# Patient Record
Sex: Male | Born: 1990 | Race: White | Hispanic: No | Marital: Single | Smoking: Current some day smoker
Health system: Southern US, Community
[De-identification: ages and names within clinical notes are randomized; demographics above are authoritative.]

## PROBLEM LIST (undated history)

## (undated) DIAGNOSIS — F909 Attention-deficit hyperactivity disorder, unspecified type: Secondary | ICD-10-CM

## (undated) DIAGNOSIS — J45909 Unspecified asthma, uncomplicated: Secondary | ICD-10-CM

## (undated) HISTORY — PX: TONSILLECTOMY: SUR1361

## (undated) HISTORY — PX: WRIST SURGERY: SHX841

## (undated) HISTORY — PX: MOUTH SURGERY: SHX715

---
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
## 2012-01-20 ENCOUNTER — Emergency Department: Payer: Self-pay | Admitting: Emergency Medicine

## 2014-09-04 ENCOUNTER — Emergency Department: Payer: Self-pay | Admitting: Internal Medicine

## 2015-07-06 ENCOUNTER — Emergency Department
Admission: EM | Admit: 2015-07-06 | Discharge: 2015-07-06 | Disposition: A | Discharge status: Home / Self Care | Payer: Self-pay | Attending: Emergency Medicine | Admitting: Emergency Medicine

## 2015-07-06 ENCOUNTER — Encounter: Payer: Self-pay | Admitting: Emergency Medicine

## 2015-07-06 DIAGNOSIS — L03114 Cellulitis of left upper limb: Secondary | ICD-10-CM | POA: Insufficient documentation

## 2015-07-06 DIAGNOSIS — F17200 Nicotine dependence, unspecified, uncomplicated: Secondary | ICD-10-CM | POA: Insufficient documentation

## 2015-07-06 MED ORDER — CEPHALEXIN 500 MG PO CAPS: 500.0000 mg | ORAL_CAPSULE | Freq: Four times a day (QID) | ORAL | Status: DC

## 2015-07-06 NOTE — ED Notes (Signed)
Patient presents to the ED with a red, swollen, painful area to his left arm that he believes may be a spider bite. Patient first noticed area at 9:30am and states area has become much more swollen since that time.   Area has two small black dots in the center.  Patient is in no obvious distress at this time.

## 2015-07-06 NOTE — ED Provider Notes (Signed)
[facility] Emergency Department Provider Note  ____________________________________________  Time seen: Approximately 7:32 PM  I have reviewed the triage vital signs and the nursing notes.   HISTORY  Chief Complaint Abscess    HPI Shawn Fowler is a 24 y.o. male patient presents to the emergency department complaining of raised erythematous lesion to left shoulder. He states that symptoms began this morning. He denies any known bite or injury to area. He states the area is tender palpation, swollen, warm to touch. He denies any fevers or chills, shortness of breath, chest pain, nausea or vomiting. Patient is concerned that he might of been "bitten by a spider." He also endorses to similar lesions to left posterior shoulder. He has not tried medications prior to arrival.   History reviewed. No pertinent past medical history.  There are no active problems to display for this patient.   Past Surgical History  Procedure Laterality Date  . Mouth surgery    . Tonsillectomy      Current Outpatient Rx  Name  Route  Sig  Dispense  Refill  . cephALEXin (KEFLEX) 500 MG capsule   Oral   Take 1 capsule (500 mg total) by mouth 4 (four) times daily.   28 capsule   0     Allergies Review of patient's allergies indicates no known allergies.  No family history on file.  Social History Social History  Substance Use Topics  . Smoking status: Current Every Day Smoker -- 0.25 packs/day  . Smokeless tobacco: None  . Alcohol Use: Yes     Comment: occasionally    Review of Systems Constitutional: No fever/chills Eyes: No visual changes. ENT: No sore throat. Cardiovascular: Denies chest pain. Respiratory: Denies shortness of breath. Gastrointestinal: No abdominal pain.  No nausea, no vomiting.  No diarrhea.  No constipation. Genitourinary: Negative for dysuria. Musculoskeletal: Negative for back pain. Skin: Negative for rash. Endorses erythematous and  edematous lesions to left upper arm and shoulder. Neurological: Negative for headaches, focal weakness or numbness.  10-point ROS otherwise negative.  ____________________________________________   PHYSICAL EXAM:  VITAL SIGNS: ED Triage Vitals  Enc Vitals Group     BP --      Pulse --      Resp --      Temp --      Temp src --      SpO2 --      Weight --      Height --      Head Cir --      Peak Flow --      Pain Score 07/06/15 1927 7     Pain Loc --      Pain Edu? --      Excl. in GC? --     Constitutional: Alert and oriented. Well appearing and in no acute distress. Eyes: Conjunctivae are normal. PERRL. EOMI. Head: Atraumatic. Nose: No congestion/rhinnorhea. Mouth/Throat: Mucous membranes are moist.  Oropharynx non-erythematous. Neck: No stridor.   Hematological/Lymphatic/Immunilogical: No cervical lymphadenopathy. Cardiovascular: Normal rate, regular rhythm. Grossly normal heart sounds.  Good peripheral circulation. Respiratory: Normal respiratory effort.  No retractions. Lungs CTAB. Gastrointestinal: Soft and nontender. No distention. No abdominal bruits. No CVA tenderness. Musculoskeletal: No lower extremity tenderness nor edema.  No joint effusions. Neurologic:  Normal speech and language. No gross focal neurologic deficits are appreciated. No gait instability. Skin:  Skin is warm, dry and intact. No rash noted. Erythematous, edematous lesion noted to left lateral upper arm. Area  has minimal central erosion. No pustular drainage. Area is firm to palpation with no fluctuance. Similar appearing lesions are noted to posterior left shoulder. Those areas are firm to palpation with no fluctuance and drainage noted. No surrounding erythema or ecchymosis are noted. Psychiatric: Mood and affect are normal. Speech and behavior are normal.  ____________________________________________   LABS (all labs ordered are listed, but only abnormal results are displayed)  Labs  Reviewed - No data to display ____________________________________________  EKG   ____________________________________________  RADIOLOGY   ____________________________________________   PROCEDURES  Procedure(s) performed: None  Critical Care performed: No  ____________________________________________   INITIAL IMPRESSION / ASSESSMENT AND PLAN / ED COURSE  Pertinent labs & imaging results that were available during my care of the patient were reviewed by me and considered in my medical decision making (see chart for details).  Patient's history, symptoms, physical exam are taken into consideration for diagnosis. Diagnosis consistent with cellulitis from unknown etiology. There is no fluctuance or need for incision and drainage at this time. I advised the patient to place him on antibiotics and that he can take Tylenol, ibuprofen, hydrocortisone cream for any additional symptomatic control. She was given strict ED precautions to return for worsening of symptoms to include fevers or chills, increasing pain, increasing swelling or drainage. Patient verbalizes understanding of the diagnosis and treatment plan and verbalizes compliance with same. ____________________________________________   FINAL CLINICAL IMPRESSION(S) / ED DIAGNOSES  Final diagnoses:  Cellulitis of left upper extremity      Racheal PatchesJonathan D Cuthriell, PA-C 07/06/15 1951  Jennye MoccasinBrian S Quigley, MD 07/06/15 2021

## 2015-07-06 NOTE — Discharge Instructions (Signed)

## 2016-06-21 ENCOUNTER — Encounter: Payer: Self-pay | Admitting: *Deleted

## 2016-06-21 ENCOUNTER — Emergency Department
Admission: EM | Admit: 2016-06-21 | Discharge: 2016-06-21 | Disposition: A | Discharge status: Home / Self Care | Payer: Self-pay | Attending: Student in an Organized Health Care Education/Training Program | Admitting: Student in an Organized Health Care Education/Training Program

## 2016-06-21 DIAGNOSIS — F17200 Nicotine dependence, unspecified, uncomplicated: Secondary | ICD-10-CM | POA: Insufficient documentation

## 2016-06-21 DIAGNOSIS — Z792 Long term (current) use of antibiotics: Secondary | ICD-10-CM | POA: Insufficient documentation

## 2016-06-21 DIAGNOSIS — L02415 Cutaneous abscess of right lower limb: Secondary | ICD-10-CM | POA: Insufficient documentation

## 2016-06-21 DIAGNOSIS — L0291 Cutaneous abscess, unspecified: Secondary | ICD-10-CM

## 2016-06-21 MED ORDER — SULFAMETHOXAZOLE-TRIMETHOPRIM 400-80 MG PO TABS: 1.0000 | ORAL_TABLET | Freq: Two times a day (BID) | ORAL | 0 refills | Status: AC

## 2016-06-21 MED ORDER — SULFAMETHOXAZOLE-TRIMETHOPRIM 800-160 MG PO TABS
ORAL_TABLET | ORAL | Status: AC
  Administered 2016-06-21: 1 via ORAL
  Filled 2016-06-21: qty 1

## 2016-06-21 MED ORDER — SULFAMETHOXAZOLE-TRIMETHOPRIM 800-160 MG PO TABS
1.0000 | ORAL_TABLET | Freq: Once | ORAL | Status: AC
  Administered 2016-06-21: 1 via ORAL

## 2016-06-21 NOTE — ED Notes (Signed)
Pt c/o area of redness with black center above left knee that began Monday. Pt Squeezed area until black center ruptured. Pt reports he has been squeezing out pink/purple/yellow/black and bloody drainage. Area is reddened and warm to touch.

## 2016-06-21 NOTE — ED Notes (Signed)
Reviewed d/c instructions, follow-up care and prescription with pt. Pt verbalized understanding 

## 2016-06-21 NOTE — ED Triage Notes (Signed)
Pt to triage via wheelchair.  Pt has possible spider bite to left upper leg above the knee.  Area red with drainage.

## 2016-06-21 NOTE — ED Provider Notes (Signed)
[facility] Emergency Department Provider Note  ____________________________________________  Time seen: Approximately 11:25 PM  I have reviewed the triage vital signs and the nursing notes.   HISTORY  Chief Complaint Insect Bite   HPI Shawn Fowler is a 25 y.o. male that presents with area of swelling and warmth above the left knee for 2 days. This morning patient squeezed the area until it started draining yellow and brown bloody pus. Area is painful and warm to touch. Patient did not see insect. Patient has never had anything like this before. Patient has not taken anything for pain. Patient is not allergic to any antibiotics.  No past medical history on file.  There are no active problems to display for this patient.   Past Surgical History:  Procedure Laterality Date  . MOUTH SURGERY    . TONSILLECTOMY      Prior to Admission medications   Medication Sig Start Date End Date Taking? Authorizing Provider  cephALEXin (KEFLEX) 500 MG capsule Take 1 capsule (500 mg total) by mouth 4 (four) times daily. 07/06/15   Delorise RoyalsJonathan D Cuthriell, PA-C  sulfamethoxazole-trimethoprim (BACTRIM) 400-80 MG tablet Take 1 tablet by mouth 2 (two) times daily. 06/21/16 07/01/16  Enid DerryAshley Korri Ask, PA-C    Allergies Patient has no known allergies.  No family history on file.  Social History Social History  Substance Use Topics  . Smoking status: Current Every Day Smoker    Packs/day: 0.25  . Smokeless tobacco: Never Used  . Alcohol use Yes     Comment: occasionally    Review of Systems  Constitutional: Negative for fever/chills Respiratory: Negative for shortness of breath. Musculoskeletal: Positive for knee pain. Limited ROM of knee. Neurological: Negative for focal weakness or numbness. ____________________________________________   PHYSICAL EXAM:  VITAL SIGNS: ED Triage Vitals  Enc Vitals Group     BP 06/21/16 2148 136/78     Pulse Rate 06/21/16 2148 96      Resp 06/21/16 2148 18     Temp 06/21/16 2148 98.3 F (36.8 C)     Temp Source 06/21/16 2148 Oral     SpO2 06/21/16 2148 99 %     Weight 06/21/16 2150 296 lb (134.3 kg)     Height 06/21/16 2150 6\' 2"  (1.88 m)     Head Circumference --      Peak Flow --      Pain Score 06/21/16 2150 10     Pain Loc --      Pain Edu? --      Excl. in GC? --      Constitutional: Alert and oriented. Well appearing and in no acute distress. Eyes: Conjunctivae are normal. EOMI. Nose: No congestion/rhinnorhea. Mouth/Throat: Mucous membranes are moist.   Neck: No stridor. Cardiovascular: Good peripheral circulation. Respiratory: Normal respiratory effort.  No retractions.  Musculoskeletal: FROM throughout. Neurologic:  Normal speech and language. No gross focal neurologic deficits are appreciated. Skin: 3 cm area of swelling and erythema above right knee. Warm to touch. Area actively draining pus and blood.  ____________________________________________   LABS (all labs ordered are listed, but only abnormal results are displayed)  Labs Reviewed  AEROBIC CULTURE (SUPERFICIAL SPECIMEN)   ____________________________________________   INITIAL IMPRESSION / ASSESSMENT AND PLAN / ED COURSE  Clinical Course     Pertinent labs & imaging results that were available during my care of the patient were reviewed by me and considered in my medical decision making (see chart for details).  My assessment  is that patient has an abscess. Since area is actively draining, it does not need to be incised. Patient should take Bactrim for 10 days.  Shawn Fowler was advised to follow up with PCP.  Shawn Fowler was also advised to return to the emergency department for symptoms that change or worsen if unable to schedule an appointment.  ____________________________________________   FINAL CLINICAL IMPRESSION(S) / ED DIAGNOSES  Final diagnoses:  Abscess    Discharge Medication List as of 06/21/2016  11:00 PM    START taking these medications   Details  sulfamethoxazole-trimethoprim (BACTRIM) 400-80 MG tablet Take 1 tablet by mouth 2 (two) times daily., Starting Thu 06/21/2016, Until Sun 07/01/2016, Print        Note:  This document was prepared using Dragon voice recognition software and may include unintentional dictation errors.    Enid DerryAshley Jorge Retz, PA-C 06/21/16 2332    Willy EddyPatrick Robinson, MD 06/21/16 [phone]

## 2016-06-24 LAB — AEROBIC CULTURE W GRAM STAIN (SUPERFICIAL SPECIMEN)

## 2016-06-24 LAB — AEROBIC CULTURE  (SUPERFICIAL SPECIMEN)

## 2018-07-16 ENCOUNTER — Other Ambulatory Visit: Payer: Self-pay

## 2018-07-16 ENCOUNTER — Emergency Department
Admission: EM | Admit: 2018-07-16 | Discharge: 2018-07-16 | Disposition: A | Discharge status: Home / Self Care | Payer: Self-pay | Attending: Emergency Medicine | Admitting: Emergency Medicine

## 2018-07-16 ENCOUNTER — Emergency Department: Payer: Self-pay

## 2018-07-16 ENCOUNTER — Encounter: Payer: Self-pay | Admitting: Emergency Medicine

## 2018-07-16 DIAGNOSIS — R3911 Hesitancy of micturition: Secondary | ICD-10-CM | POA: Insufficient documentation

## 2018-07-16 DIAGNOSIS — J40 Bronchitis, not specified as acute or chronic: Secondary | ICD-10-CM | POA: Insufficient documentation

## 2018-07-16 DIAGNOSIS — R0602 Shortness of breath: Secondary | ICD-10-CM | POA: Insufficient documentation

## 2018-07-16 DIAGNOSIS — R300 Dysuria: Secondary | ICD-10-CM | POA: Insufficient documentation

## 2018-07-16 DIAGNOSIS — F17210 Nicotine dependence, cigarettes, uncomplicated: Secondary | ICD-10-CM | POA: Insufficient documentation

## 2018-07-16 HISTORY — DX: Attention-deficit hyperactivity disorder, unspecified type: F90.9

## 2018-07-16 HISTORY — DX: Unspecified asthma, uncomplicated: J45.909

## 2018-07-16 LAB — URINALYSIS, COMPLETE (UACMP) WITH MICROSCOPIC
Bacteria, UA: NONE SEEN
Bilirubin Urine: NEGATIVE
Glucose, UA: NEGATIVE mg/dL
Hgb urine dipstick: NEGATIVE
KETONES UR: NEGATIVE mg/dL
Leukocytes, UA: NEGATIVE
Nitrite: NEGATIVE
PH: 6 (ref 5.0–8.0)
Protein, ur: NEGATIVE mg/dL
Specific Gravity, Urine: 1.016 (ref 1.005–1.030)
Squamous Epithelial / LPF: NONE SEEN (ref 0–5)

## 2018-07-16 MED ORDER — ALBUTEROL SULFATE (2.5 MG/3ML) 0.083% IN NEBU
2.5000 mg | INHALATION_SOLUTION | Freq: Once | RESPIRATORY_TRACT | Status: AC
  Administered 2018-07-16: 2.5 mg via RESPIRATORY_TRACT
  Filled 2018-07-16: qty 3

## 2018-07-16 MED ORDER — METHYLPREDNISOLONE SODIUM SUCC 125 MG IJ SOLR
125.0000 mg | Freq: Once | INTRAMUSCULAR | Status: AC
  Administered 2018-07-16: 125 mg via INTRAMUSCULAR
  Filled 2018-07-16: qty 2

## 2018-07-16 NOTE — ED Notes (Addendum)
Pt to doorway and states he is wanting to "walk out". He is feeling better and does not want to wait for his discharge papers or his prescriptions.  Verbalized understanding of needing to return if symptoms worsen. Computers down so pt unable to sign.

## 2018-07-16 NOTE — ED Triage Notes (Signed)
Pt in via POV, reports cough, congestion, shortness of breath x approximately 2 weeks.  Pt is a smoker, hx of asthma.  Pt also reports urinary symptoms, states it is difficulty to start stream, urine w/ odor to it.  Vitals WDL, NAD noted at this time.

## 2018-07-16 NOTE — ED Notes (Addendum)
See triage note. Presents with a 2 week hx of congestion and cough.states cough has been prod at times  Denies any fever   Some SOB and feels like he can't get a good breath

## 2018-07-16 NOTE — ED Provider Notes (Signed)
[facility] Emergency Department Provider Note  ____________________________________________  Time seen: Approximately 11:22 PM  I have reviewed the triage vital signs and the nursing notes.   HISTORY  Chief Complaint URI    HPI Shawn Fowler is a 27 y.o. male who presents the emergency department for complaint of nasal congestion, coughing, shortness of breath, wheezing, dysuria and urinary hesitancy.   Patient presents primarily for cough, shortness of breath that has been ongoing x2 weeks.  Patient reports he has asthma, and was unsure whether he had viral upper respiratory symptoms or an asthma exacerbation.  Patient reports symptoms became worse over the past 3 days.  He presents the emergency department tonight for evaluation of same.  He denies any ear pain, sore throat, fevers or chills, chest pain, difficulty breathing.  Patient does report chest tightness with wheezing.  Patient is also requesting evaluation of dysuria, urinary hesitancy.  Patient reports that C the symptoms have been off and on but primarily the last week.  Patient denies any discharge.  No abdominal pain, suprapubic pain, flank pain.  No hematuria.  No flank pain or history of kidney stone.   Past Medical History:  Diagnosis Date  . ADHD   . Asthma     There are no active problems to display for this patient.   Past Surgical History:  Procedure Laterality Date  . MOUTH SURGERY    . TONSILLECTOMY      Prior to Admission medications   Medication Sig Start Date End Date Taking? Authorizing Provider  cephALEXin (KEFLEX) 500 MG capsule Take 1 capsule (500 mg total) by mouth 4 (four) times daily. 07/06/15   Gaje Tennyson, Delorise RoyalsJonathan D, PA-C    Allergies Patient has no known allergies.  No family history on file.  Social History Social History   Tobacco Use  . Smoking status: Current Every Day Smoker    Packs/day: 0.25  . Smokeless tobacco: Never Used  Substance Use Topics   . Alcohol use: Yes    Comment: occasionally  . Drug use: Not Currently     Review of Systems  Constitutional: No fever/chills Eyes: No visual changes. No discharge ENT: No upper respiratory complaints. Cardiovascular: no chest pain. Respiratory: Positive cough.  Positive SOB. Gastrointestinal: No abdominal pain.  No nausea, no vomiting.  No diarrhea.  No constipation. Genitourinary: Positive for dysuria and urinary hesitancy.. No hematuria Musculoskeletal: Negative for musculoskeletal pain. Skin: Negative for rash, abrasions, lacerations, ecchymosis. Neurological: Negative for headaches, focal weakness or numbness. 10-point ROS otherwise negative.  ____________________________________________   PHYSICAL EXAM:  VITAL SIGNS: ED Triage Vitals  Enc Vitals Group     BP 07/16/18 1801 (!) 148/79     Pulse Rate 07/16/18 1801 76     Resp --      Temp 07/16/18 1801 97.7 F (36.5 C)     Temp Source 07/16/18 1801 Oral     SpO2 07/16/18 1801 99 %     Weight 07/16/18 1801 220 lb (99.8 kg)     Height 07/16/18 1801 6\' 2"  (1.88 m)     Head Circumference --      Peak Flow --      Pain Score 07/16/18 1808 4     Pain Loc --      Pain Edu? --      Excl. in GC? --      Constitutional: Alert and oriented. Well appearing and in no acute distress. Eyes: Conjunctivae are normal. PERRL. EOMI. Head: Atraumatic.  ENT:      Ears: EACs and TMs unremarkable bilaterally      Nose: No congestion/rhinnorhea.      Mouth/Throat: Mucous membranes are moist.  Neck: No stridor.  Neck is supple for range of motion Hematological/Lymphatic/Immunilogical: No cervical lymphadenopathy. Cardiovascular: Normal rate, regular rhythm. Normal S1 and S2.  Good peripheral circulation. Respiratory: Normal respiratory effort without tachypnea or retractions. Lungs with diffuse expiratory wheezes.  No rales or rhonchi.Peri Jefferson air entry to the bases with no decreased or absent breath sounds. Gastrointestinal: Bowel  sounds 4 quadrants. Soft and nontender to palpation. No guarding or rigidity. No palpable masses. No distention. No CVA tenderness. Musculoskeletal: Full range of motion to all extremities. No gross deformities appreciated. Neurologic:  Normal speech and language. No gross focal neurologic deficits are appreciated.  Skin:  Skin is warm, dry and intact. No rash noted. Psychiatric: Mood and affect are normal. Speech and behavior are normal. Patient exhibits appropriate insight and judgement.   ____________________________________________   LABS (all labs ordered are listed, but only abnormal results are displayed)  Labs Reviewed  URINALYSIS, COMPLETE (UACMP) WITH MICROSCOPIC - Abnormal; Notable for the following components:      Result Value   Color, Urine YELLOW (*)    APPearance CLEAR (*)    All other components within normal limits  CHLAMYDIA/NGC RT PCR ([facility] ONLY)   ____________________________________________  EKG   ____________________________________________  RADIOLOGY I personally viewed and evaluated these images as part of my medical decision making, as well as reviewing the written report by the radiologist.  Dg Chest 2 View  Result Date: 07/16/2018 CLINICAL DATA:  Productive cough, shortness of breath EXAM: CHEST - 2 VIEW COMPARISON:  07/30/2012 FINDINGS: Mild peribronchial thickening. Heart and mediastinal contours are within normal limits. No focal opacities or effusions. No acute bony abnormality. IMPRESSION: Mild bronchitic changes. Electronically Signed   By: Charlett Nose M.D.   On: 07/16/2018 19:04    ____________________________________________    PROCEDURES  Procedure(s) performed:    Procedures    Medications  albuterol (PROVENTIL) (2.5 MG/3ML) 0.083% nebulizer solution 2.5 mg (2.5 mg Nebulization Given 07/16/18 2119)  methylPREDNISolone sodium succinate (SOLU-MEDROL) 125 mg/2 mL injection 125 mg (125 mg Intramuscular Given 07/16/18 2119)      ____________________________________________   INITIAL IMPRESSION / ASSESSMENT AND PLAN / ED COURSE  Pertinent labs & imaging results that were available during my care of the patient were reviewed by me and considered in my medical decision making (see chart for details).  Review of the [place] CSRS was performed in accordance of the [facility] prior to dispensing any controlled drugs.      Patient's diagnosis is consistent with bronchitis and urinary hesitancy.  Patient presents the emergency department with 2 complaints.  Main complaint was cough, shortness of breath.  Patient does have a history of asthma.  Exam is consistent with bronchitis with mild asthma exacerbation.  Patient received Solu-Medrol and albuterol emergency department with good symptom improvement.  Patient was evaluated with urinalysis and gonorrhea and chlamydia.  Patient was awaiting gonorrhea and Chlamydia testing, the lab was called and reports that they were running same.  No results were ever obtained for gonorrhea and chlamydia and lab was was unable to find sample or provide results.  Patient was also awaiting further discharge instructions and paperwork, however an unplanned computer maintenance, systemwide shutdown computers for over an hour.  Patient would not wait for discharge paperwork and prescriptions and leaves AMA..  No prescriptions are  provided to patient.  Patient is given ED precautions to return to the ED for any worsening or new symptoms.     ____________________________________________  FINAL CLINICAL IMPRESSION(S) / ED DIAGNOSES  Final diagnoses:  Bronchitis  Urinary hesitancy      NEW MEDICATIONS STARTED DURING THIS VISIT:  ED Discharge Orders    None          This chart was dictated using voice recognition software/Dragon. Despite best efforts to proofread, errors can occur which can change the meaning. Any change was purely unintentional.    Racheal Patches,  PA-C 07/16/18 2334    Nita Sickle, MD 07/17/18 [phone]

## 2018-07-16 NOTE — ED Notes (Signed)
Lab to add on test to urine already in lab

## 2018-07-17 LAB — CHLAMYDIA/NGC RT PCR (ARMC ONLY)
Chlamydia Tr: NOT DETECTED
N gonorrhoeae: NOT DETECTED

## 2018-07-18 ENCOUNTER — Other Ambulatory Visit: Payer: Self-pay

## 2018-07-18 ENCOUNTER — Encounter (HOSPITAL_COMMUNITY): Payer: Self-pay | Admitting: Emergency Medicine

## 2018-07-18 ENCOUNTER — Emergency Department (HOSPITAL_COMMUNITY)
Admission: EM | Admit: 2018-07-18 | Discharge: 2018-07-18 | Disposition: A | Discharge status: Home / Self Care | Payer: Self-pay | Attending: Emergency Medicine | Admitting: Emergency Medicine

## 2018-07-18 DIAGNOSIS — K029 Dental caries, unspecified: Secondary | ICD-10-CM | POA: Insufficient documentation

## 2018-07-18 DIAGNOSIS — F17210 Nicotine dependence, cigarettes, uncomplicated: Secondary | ICD-10-CM | POA: Insufficient documentation

## 2018-07-18 DIAGNOSIS — J45909 Unspecified asthma, uncomplicated: Secondary | ICD-10-CM | POA: Insufficient documentation

## 2018-07-18 MED ORDER — CLINDAMYCIN HCL 150 MG PO CAPS
150.0000 mg | ORAL_CAPSULE | Freq: Once | ORAL | Status: AC
  Administered 2018-07-18: 150 mg via ORAL
  Filled 2018-07-18: qty 1

## 2018-07-18 MED ORDER — CLINDAMYCIN HCL 150 MG PO CAPS: 150.0000 mg | ORAL_CAPSULE | Freq: Four times a day (QID) | ORAL | 0 refills | Status: DC

## 2018-07-18 MED ORDER — ACETAMINOPHEN 325 MG PO TABS
650.0000 mg | ORAL_TABLET | Freq: Once | ORAL | Status: AC
  Administered 2018-07-18: 650 mg via ORAL
  Filled 2018-07-18: qty 2

## 2018-07-18 NOTE — ED Triage Notes (Signed)
Pt c/o dental pain and sensitivity in multiple areas of mouth x a cpl of mos

## 2018-07-18 NOTE — ED Provider Notes (Signed)
[facility] EMERGENCY DEPARTMENT Provider Note   CSN: [id number] Arrival date & time: 07/18/18  0259     History   Chief Complaint Chief Complaint  Patient presents with  . Dental Pain    HPI Shawn Fowler is a 27 y.o. male.  The history is provided by the patient.  He has history of asthma and attention deficit disorder and comes in complaining of dental pain for the last several months.  He has pain in multiple teeth.  Pain is sometimes worse with hot food and sometimes worse with cold food.  He has seen a dentist in the past and was told that it would cost several $100 per tooth to have fillings placed and he cannot afford that.  So he has not gone back to the dentist.  He also relates that he has been sucking on a lot of candy because he is trying to quit smoking.  Past Medical History:  Diagnosis Date  . ADHD   . Asthma     There are no active problems to display for this patient.   Past Surgical History:  Procedure Laterality Date  . MOUTH SURGERY    . TONSILLECTOMY          Home Medications    Prior to Admission medications   Medication Sig Start Date End Date Taking? Authorizing Provider  cephALEXin (KEFLEX) 500 MG capsule Take 1 capsule (500 mg total) by mouth 4 (four) times daily. 07/06/15   Cuthriell, Delorise Royals, PA-C    Family History History reviewed. No pertinent family history.  Social History Social History   Tobacco Use  . Smoking status: Current Every Day Smoker    Packs/day: 0.25  . Smokeless tobacco: Never Used  Substance Use Topics  . Alcohol use: Yes    Comment: occasionally  . Drug use: Not Currently     Allergies   Penicillins and Aspirin   Review of Systems Review of Systems  All other systems reviewed and are negative.    Physical Exam Updated Vital Signs BP (!) 160/101 (BP Location: Left Arm)   Pulse (!) 120   Temp 97.9 F (36.6 C) (Oral)   Resp 17   Ht 6' (1.829 m) Comment: Simultaneous filing. User may not  have seen previous data.  Wt 108.9 kg Comment: Simultaneous filing. User may not have seen previous data.  SpO2 100%   BMI 32.55 kg/m   Physical Exam  Nursing note and vitals reviewed.  27 year old male, resting comfortably and in no acute distress. Vital signs are significant for elevated blood pressure and heart rate. Oxygen saturation is 97.9%, which is normal. Head is normocephalic and atraumatic. PERRLA, EOMI. Oropharynx is clear.  Multiple dental caries are noted.  Caries are noted on teeth #2, 6, 7, 10, 11, 21, 22, 26. Neck is nontender and supple without adenopathy or JVD. Back is nontender and there is no CVA tenderness. Lungs are clear without rales, wheezes, or rhonchi. Chest is nontender. Heart has regular rate and rhythm without murmur. Abdomen is soft, flat, nontender without masses or hepatosplenomegaly and peristalsis is normoactive. Extremities have no cyanosis or edema, full range of motion is present. Skin is warm and dry without rash. Neurologic: Mental status is normal, cranial nerves are intact, there are no motor or sensory deficits.  ED Treatments / Results   Procedures Procedures  Medications Ordered in ED Medications  clindamycin (CLEOCIN) capsule 150 mg (has no administration in time range)  acetaminophen (TYLENOL)  tablet 650 mg (has no administration in time range)     Initial Impression / Assessment and Plan / ED Course  I have reviewed the triage vital signs and the nursing notes.  Multiple dental caries.  He is given Writerdental resource page, given prescription for clindamycin.  Told to use over-the-counter analgesics as needed for pain.  Final Clinical Impressions(s) / ED Diagnoses   Final diagnoses:  Dental caries    ED Discharge Orders    None       Dione BoozeGlick, Julius Boniface, MD 07/18/18 [phone]

## 2018-09-04 ENCOUNTER — Emergency Department (HOSPITAL_COMMUNITY): Payer: No Typology Code available for payment source

## 2018-09-04 ENCOUNTER — Emergency Department (HOSPITAL_COMMUNITY)
Admission: EM | Admit: 2018-09-04 | Discharge: 2018-09-04 | Disposition: A | Discharge status: Home / Self Care | Payer: No Typology Code available for payment source | Attending: Emergency Medicine | Admitting: Emergency Medicine

## 2018-09-04 ENCOUNTER — Other Ambulatory Visit: Payer: Self-pay

## 2018-09-04 ENCOUNTER — Encounter (HOSPITAL_COMMUNITY): Payer: Self-pay | Admitting: Emergency Medicine

## 2018-09-04 DIAGNOSIS — W0110XA Fall on same level from slipping, tripping and stumbling with subsequent striking against unspecified object, initial encounter: Secondary | ICD-10-CM | POA: Insufficient documentation

## 2018-09-04 DIAGNOSIS — S6992XA Unspecified injury of left wrist, hand and finger(s), initial encounter: Secondary | ICD-10-CM | POA: Diagnosis present

## 2018-09-04 DIAGNOSIS — Y9301 Activity, walking, marching and hiking: Secondary | ICD-10-CM | POA: Insufficient documentation

## 2018-09-04 DIAGNOSIS — Y999 Unspecified external cause status: Secondary | ICD-10-CM | POA: Diagnosis not present

## 2018-09-04 DIAGNOSIS — F17200 Nicotine dependence, unspecified, uncomplicated: Secondary | ICD-10-CM | POA: Insufficient documentation

## 2018-09-04 DIAGNOSIS — S63105A Unspecified dislocation of left thumb, initial encounter: Secondary | ICD-10-CM | POA: Insufficient documentation

## 2018-09-04 DIAGNOSIS — S62002A Unspecified fracture of navicular [scaphoid] bone of left wrist, initial encounter for closed fracture: Secondary | ICD-10-CM | POA: Diagnosis not present

## 2018-09-04 DIAGNOSIS — J45909 Unspecified asthma, uncomplicated: Secondary | ICD-10-CM | POA: Diagnosis not present

## 2018-09-04 DIAGNOSIS — Y929 Unspecified place or not applicable: Secondary | ICD-10-CM | POA: Diagnosis not present

## 2018-09-04 DIAGNOSIS — Z79899 Other long term (current) drug therapy: Secondary | ICD-10-CM | POA: Insufficient documentation

## 2018-09-04 MED ORDER — HYDROCODONE-ACETAMINOPHEN 5-325 MG PO TABS: 1.0000 | ORAL_TABLET | ORAL | 0 refills | Status: DC | PRN

## 2018-09-04 MED ORDER — HYDROCODONE-ACETAMINOPHEN 5-325 MG PO TABS
2.0000 | ORAL_TABLET | Freq: Once | ORAL | Status: AC
  Administered 2018-09-04: 2 via ORAL
  Filled 2018-09-04: qty 2

## 2018-09-04 MED ORDER — LIDOCAINE HCL (PF) 1 % IJ SOLN
10.0000 mg | Freq: Once | INTRAMUSCULAR | Status: DC
  Filled 2018-09-04: qty 6

## 2018-09-04 NOTE — ED Triage Notes (Signed)
Pt reports falling on LT hand yesterday. Pt noted to have edema and possible deformity to LT thumb. Pt reported some numbness last night.

## 2018-09-04 NOTE — Discharge Instructions (Signed)
Your x-ray shows that you had a dislocation of 1 of the joints of your thumb.  This is been reduced.  You also have a fracture of the bone in your wrist called the scaphoid.  It is extremely important that this is followed by the orthopedic specialist because of the type of blood supply that goes to this particular bone.  Please use your thumb spica splint until seen by the orthopedic specialist.  Please keep your arm your hand elevated above your heart as much as possible to improve pain.  Use 1000 mg of Tylenol and 600 mg of ibuprofen every 6 hours.  Use Norco for more severe pain. This medication may cause drowsiness. Please do not drink, drive, or participate in activity that requires concentration while taking this medication. Call Dr Romeo Apple today for appointment as soon as possible.

## 2018-09-04 NOTE — ED Provider Notes (Signed)
[facility] EMERGENCY DEPARTMENT Provider Note   CSN: [id number] Arrival date & time: 09/04/18  1126     History   Chief Complaint Chief Complaint  Patient presents with  . Hand Injury    HPI Shawn Fowler is a 28 y.o. male.  Patient is a 28 year old male who presents to the emergency department with complaint of left hand injury.  The patient states that he fell on the left hand while carrying heavy chain.  This injury occurred on yesterday, January 22.  He now has pain and swelling of the hand.  He particularly has pain involving the left thumb.  Patient states that he did not sleep well because of pain during the night.  He presents to the emergency department for evaluation of this injury.  The history is provided by the patient.  Hand Injury  Location:  Hand Hand location:  L hand Associated symptoms: no back pain and no neck pain     Past Medical History:  Diagnosis Date  . ADHD   . Asthma     There are no active problems to display for this patient.   Past Surgical History:  Procedure Laterality Date  . MOUTH SURGERY    . TONSILLECTOMY          Home Medications    Prior to Admission medications   Medication Sig Start Date End Date Taking? Authorizing Provider  clindamycin (CLEOCIN) 150 MG capsule Take 1 capsule (150 mg total) by mouth every 6 (six) hours. 07/18/18   Dione Booze, MD    Family History No family history on file.  Social History Social History   Tobacco Use  . Smoking status: Current Every Day Smoker    Packs/day: 0.25  . Smokeless tobacco: Never Used  Substance Use Topics  . Alcohol use: Yes    Comment: occasionally  . Drug use: Not Currently     Allergies   Penicillins and Aspirin   Review of Systems Review of Systems  Constitutional: Negative for activity change.       All ROS Neg except as noted in HPI  HENT: Negative for nosebleeds.   Eyes: Negative for photophobia and discharge.  Respiratory: Negative for  cough, shortness of breath and wheezing.   Cardiovascular: Negative for chest pain and palpitations.  Gastrointestinal: Negative for abdominal pain and blood in stool.  Genitourinary: Negative for dysuria, frequency and hematuria.  Musculoskeletal: Negative for arthralgias, back pain and neck pain.  Skin: Negative.   Neurological: Negative for dizziness, seizures and speech difficulty.  Psychiatric/Behavioral: Negative for confusion and hallucinations.     Physical Exam Updated Vital Signs BP (!) 154/97 (BP Location: Right Arm)   Pulse 97   Temp 98.2 F (36.8 C) (Oral)   Resp 20   Ht 6\' 2"  (1.88 m)   Wt 108.9 kg   SpO2 100%   BMI 30.81 kg/m   Physical Exam Vitals signs and nursing note reviewed.  Constitutional:      Appearance: He is well-developed. He is not toxic-appearing.  HENT:     Head: Normocephalic.     Right Ear: Tympanic membrane and external ear normal.     Left Ear: Tympanic membrane and external ear normal.  Eyes:     General: Lids are normal.     Pupils: Pupils are equal, round, and reactive to light.  Neck:     Musculoskeletal: Normal range of motion and neck supple.     Vascular: No carotid bruit.  Cardiovascular:  Rate and Rhythm: Normal rate and regular rhythm.     Pulses: Normal pulses.     Heart sounds: Normal heart sounds.  Pulmonary:     Effort: No respiratory distress.     Breath sounds: Normal breath sounds.  Abdominal:     General: Bowel sounds are normal.     Palpations: Abdomen is soft.     Tenderness: There is no abdominal tenderness. There is no guarding.  Musculoskeletal: Normal range of motion.        General: Signs of injury present.     Left hand: He exhibits bony tenderness and swelling. Normal sensation noted. Normal strength noted.     Comments: There is good range of motion of the left shoulder and elbow.  There is pain in the anatomical snuffbox on the left.  There is some deformity noted of the metacarpal phalangeal joint.   The capillary refill is less than 2 seconds.  The radial pulse is 2+ on the left.  Lymphadenopathy:     Head:     Right side of head: No submandibular adenopathy.     Left side of head: No submandibular adenopathy.     Cervical: No cervical adenopathy.  Skin:    General: Skin is warm and dry.  Neurological:     Mental Status: He is alert and oriented to person, place, and time.     Cranial Nerves: No cranial nerve deficit.     Sensory: No sensory deficit.  Psychiatric:        Speech: Speech normal.      ED Treatments / Results  Labs (all labs ordered are listed, but only abnormal results are displayed) Labs Reviewed - No data to display  EKG None  Radiology No results found.  Procedures .Ortho Injury Treatment Date/Time: 09/04/2018 2:44 PM Performed by: Ivery QualeBryant, Virgin Zellers, PA-C Authorized by: Ivery QualeBryant, Arieonna Medine, PA-C   Consent:    Consent obtained:  Verbal   Consent given by:  Patient   Procedural risks discussed: MCP dislocation.   Alternatives discussed:  Referral Universal protocol:    Procedure explained and questions answered to patient or proxy's satisfaction: yes     Immediately prior to procedure a time out was called: yes     Patient identity confirmed:  Arm bandInjury location: hand Location details: left hand Injury type: dislocation Dislocation type: carpometacarpal (thumb) Pre-procedure neurovascular assessment: neurovascularly intact Pre-procedure distal perfusion: normal Pre-procedure neurological function: normal Pre-procedure range of motion: reduced Anesthesia: digital block  Anesthesia: Local anesthesia used: yes Local Anesthetic: lidocaine 1% without epinephrine Anesthetic total: 7 mL  Patient sedated: NoManipulation performed: yes Reduction successful: yes Immobilization: splint Splint type: thumb spica Supplies used: Velcro Thumb Spica splint. Post-procedure neurovascular assessment: post-procedure neurovascularly intact Post-procedure  distal perfusion: normal Post-procedure neurological function: normal Post-procedure range of motion: improved Patient tolerance: Patient tolerated the procedure well with no immediate complications    (including critical care time) FRACTURE CARE LEFT WRIST. Patient sustained a fall while carrying a very heavy chain.  He sustained injury to the left hand and wrist.  An x-ray of the wrist shows a fracture at the mid and distal thirds of the scaphoid bone.  I have discussed the fracture with the patient in terms which he understands.  I also discussed the need for immobilization.  Patient was identified by armband.  The capillary refill is less than 2 seconds in the radial pulses 2+.  There are no temperature changes appreciated.  The patient was fitted with a  Velcro thumb spica splint.  Following application of the splint, there was no temperature changes.  The capillary refill remains intact.  There is no sensation of it being too tight.  The patient tolerated the procedure with minimal problem.  Patient was treated with ice pack and medication for pain.  Medications Ordered in ED Medications - No data to display   Initial Impression / Assessment and Plan / ED Course  I have reviewed the triage vital signs and the nursing notes.  Pertinent labs & imaging results that were available during my care of the patient were reviewed by me and considered in my medical decision making (see chart for details).      Final Clinical Impressions(s) / ED Diagnoses MDM  Patient sustained a fall with a heavy chain and hand.  He injured the wrist and the left thumb.  The patient underwent reduction of the metacarpal phalangeal joint.  The patient was also noted to have a fracture at the scaphoid bone.  The patient was immobilized.  No neurovascular deficits appreciated on examination.  The patient is treated with Tylenol, and/or ibuprofen for mild pain, Norco for more severe pain.  I have advised the  patient to keep his hand and wrist elevated above his heart.  Patient is referred to orthopedics for additional evaluation and management.   Final diagnoses:  Closed displaced fracture of distal pole of scaphoid bone of left wrist, initial encounter  Closed dislocation of left thumb, initial encounter    ED Discharge Orders         Ordered    HYDROcodone-acetaminophen (NORCO/VICODIN) 5-325 MG tablet  Every 4 hours PRN     09/04/18 1459           Ivery QualeBryant, Elvie Maines, PA-C 09/04/18 2127    Maia PlanLong, Joshua G, MD 09/06/18 [phone]

## 2018-09-05 ENCOUNTER — Telehealth: Payer: Self-pay | Admitting: Orthopedic Surgery

## 2018-09-05 NOTE — Telephone Encounter (Signed)
Voice message received, call/message was left at 12:03pm after hours - I returned call and received voice mail, and have left a message in response:  Workers Designer, industrial/product Lynette(?Wynette?) [employer], from [employer], insurer for [employer], states patient was treated at [facility], 09/04/2018 for fracture of left wrist. Relayed to please call back to further discuss scheduling of appointment* *Received call back 12:35pm from Miss Jerilynn Som - states she has already scheduled patient with another orthopaedic specialist today.  Thanked [place] for our response.

## 2019-03-24 IMAGING — CR DG CHEST 2V
1 series · 2 of 2 positions shown · non-contrast
Comparison: 07/30/2012

CLINICAL DATA: Productive cough, shortness of breath

EXAM:
CHEST - 2 VIEW

[Series 1: dg chest 2 view · 0.14mm/px · 2 of 2 slices shown]
[im 1/2]
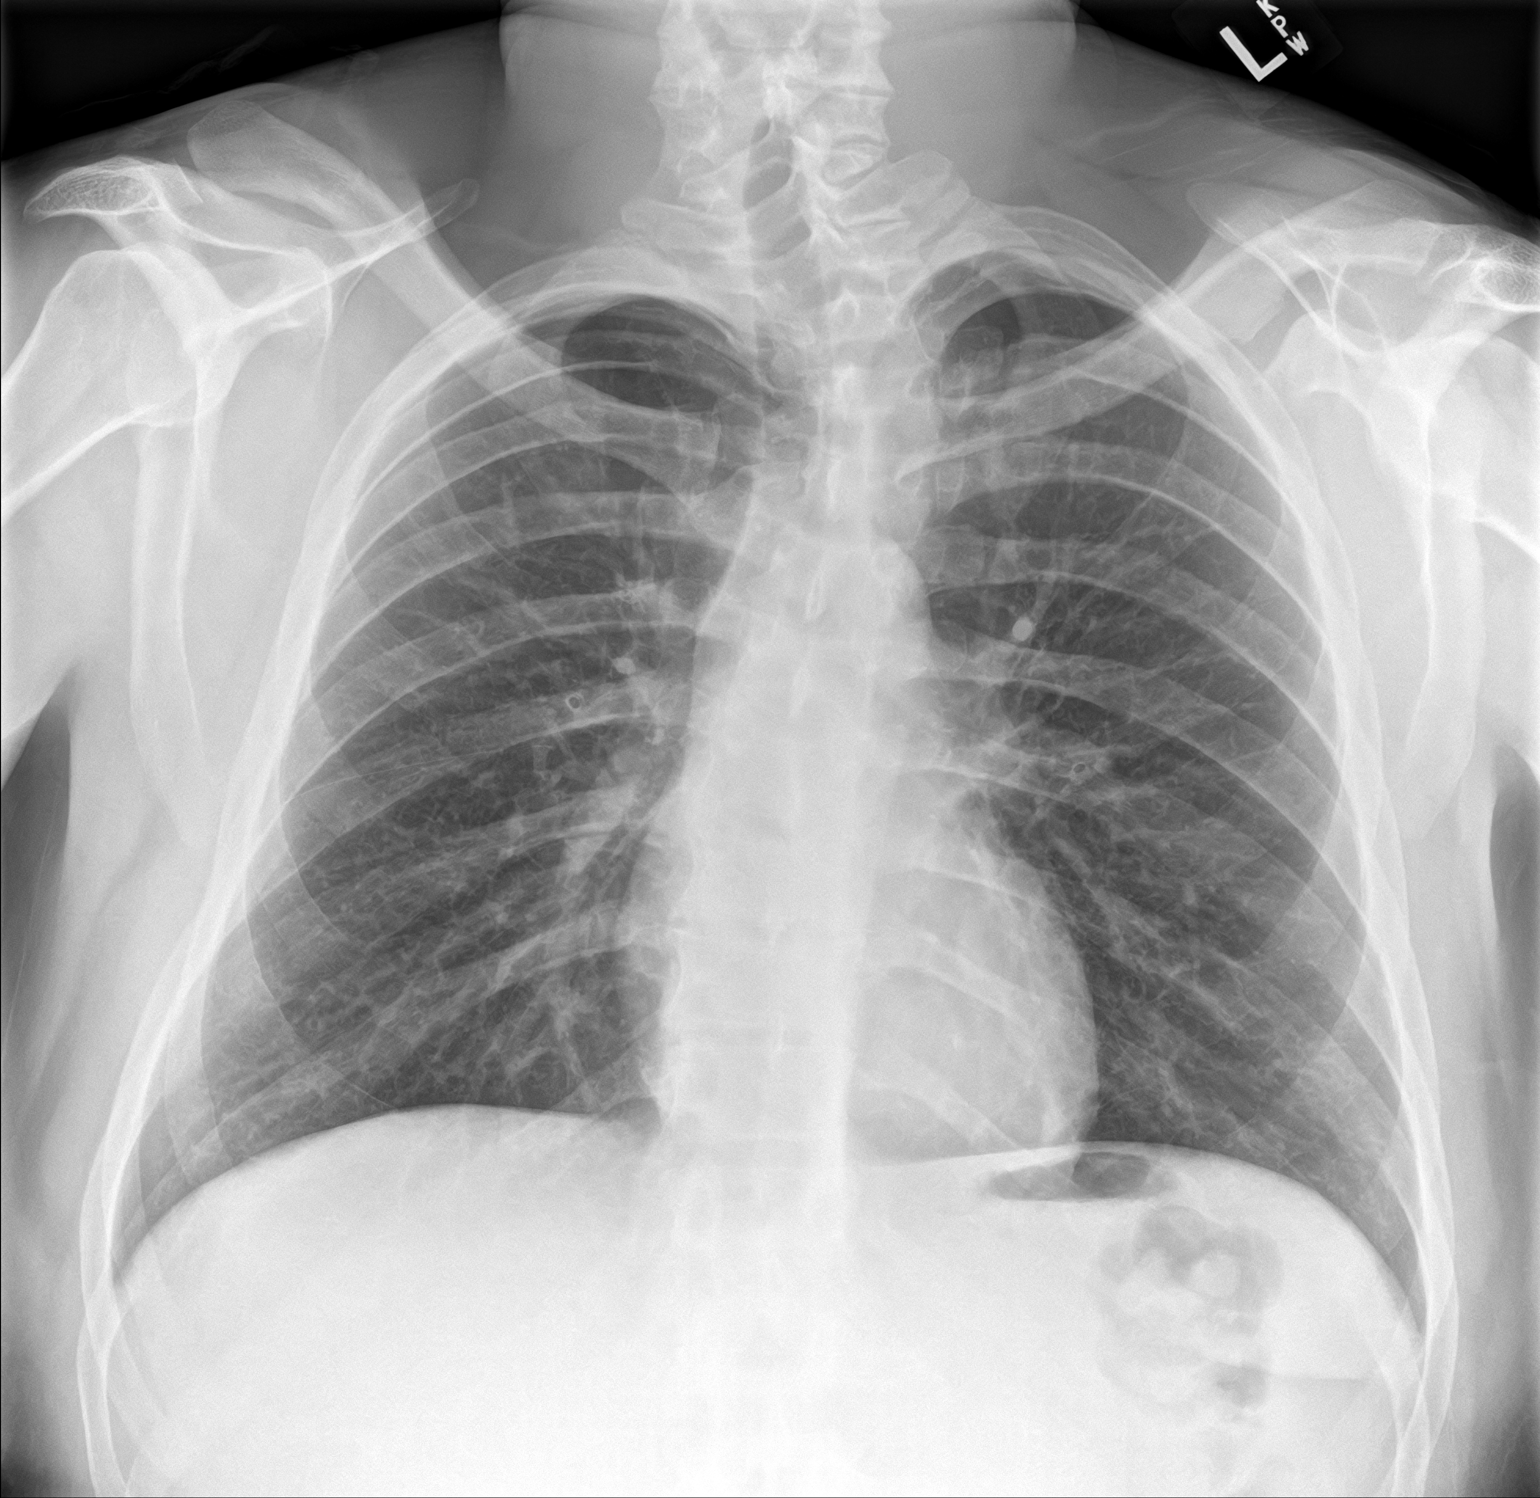
[im 2/2]
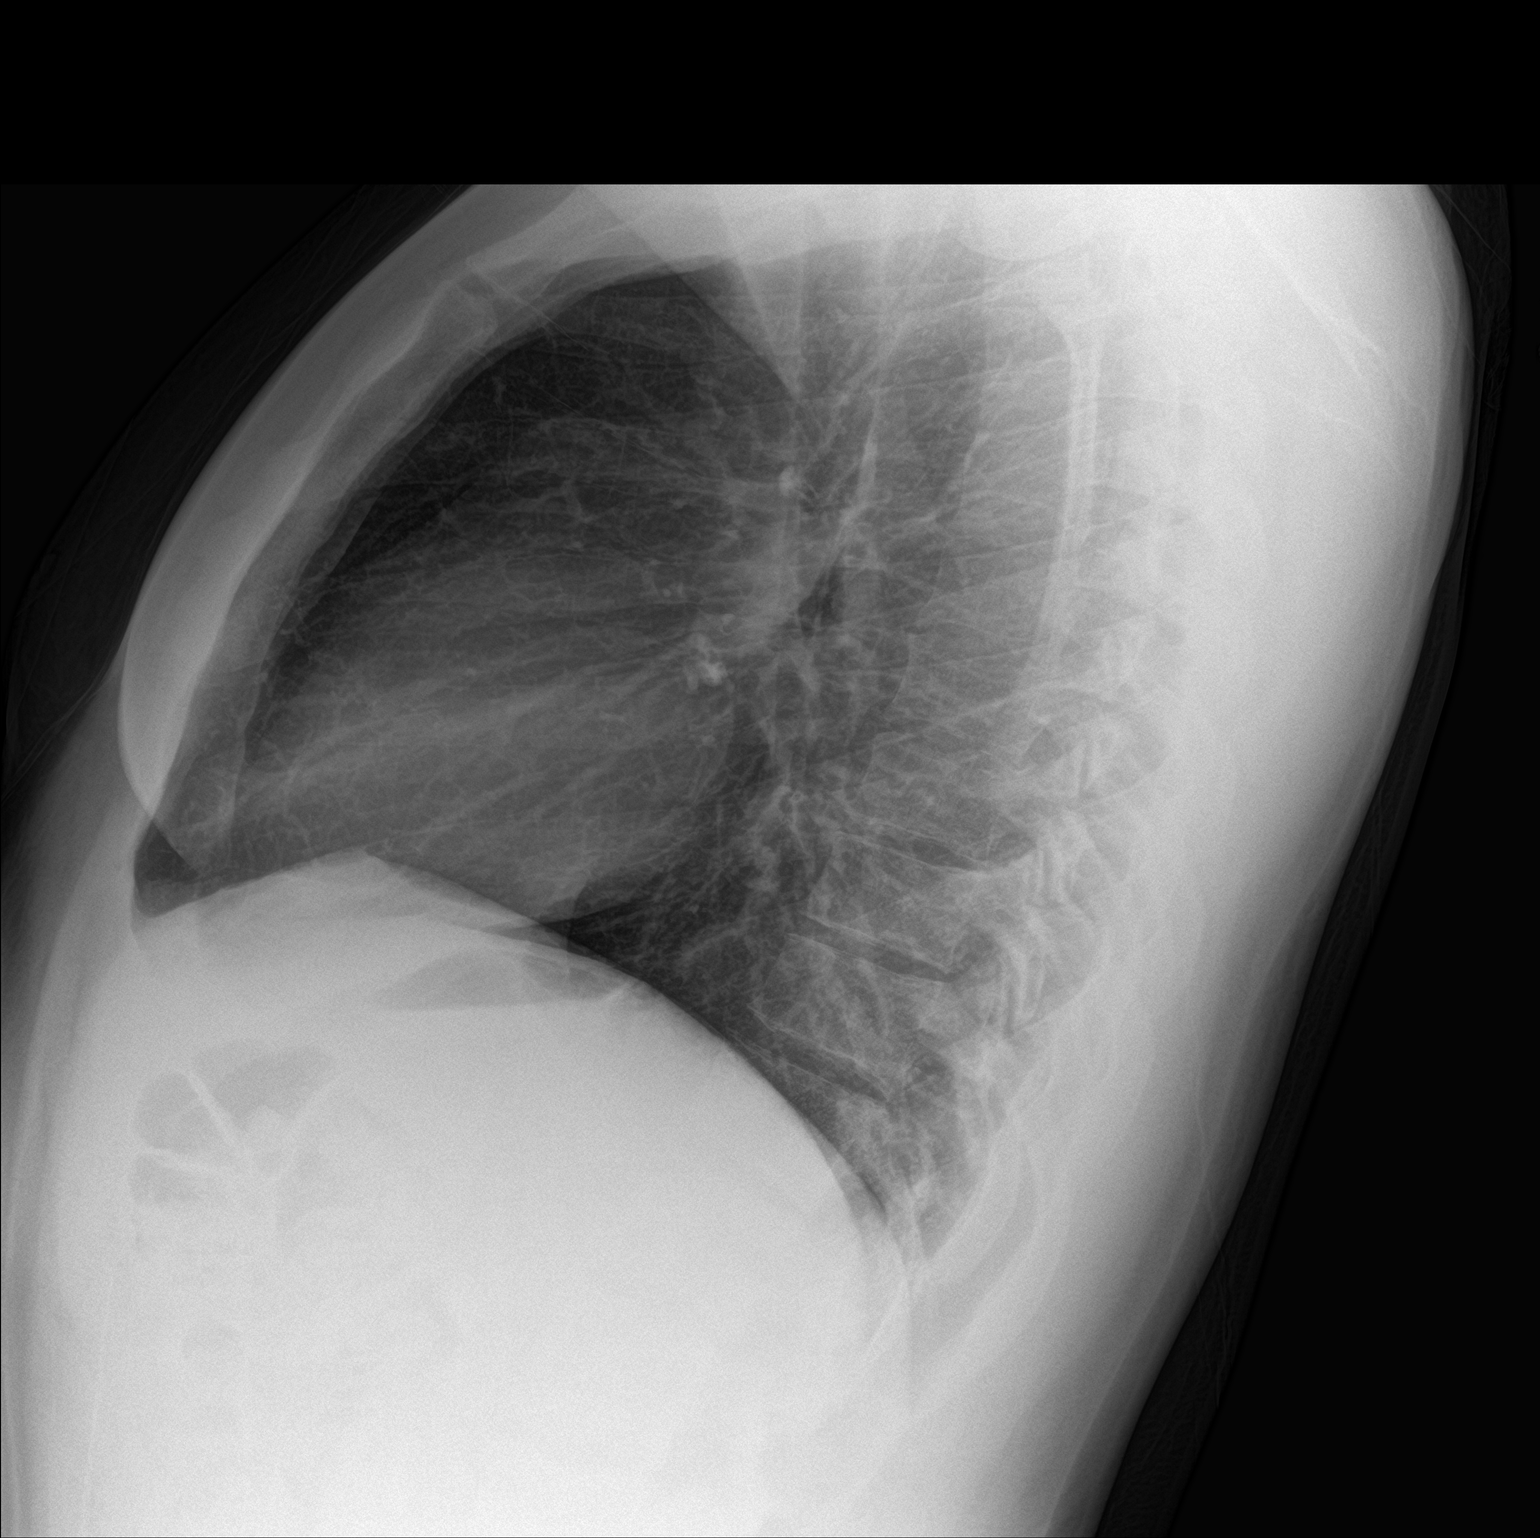

[2 of 2 positions shown; findings below may reference images not displayed]

FINDINGS: Mild peribronchial thickening. Heart and mediastinal contours are
within normal limits. No focal opacities or effusions. No acute bony
abnormality.
IMPRESSION: Mild bronchitic changes.

## 2019-03-25 ENCOUNTER — Other Ambulatory Visit: Payer: Self-pay

## 2019-03-25 ENCOUNTER — Ambulatory Visit: Payer: Self-pay

## 2019-03-25 DIAGNOSIS — Z113 Encounter for screening for infections with a predominantly sexual mode of transmission: Secondary | ICD-10-CM

## 2019-03-25 DIAGNOSIS — F419 Anxiety disorder, unspecified: Secondary | ICD-10-CM

## 2019-03-25 DIAGNOSIS — A549 Gonococcal infection, unspecified: Secondary | ICD-10-CM

## 2019-03-25 DIAGNOSIS — Z202 Contact with and (suspected) exposure to infections with a predominantly sexual mode of transmission: Secondary | ICD-10-CM

## 2019-03-25 LAB — GRAM STAIN

## 2019-03-25 MED ORDER — AZITHROMYCIN 500 MG PO TABS
2000.0000 mg | ORAL_TABLET | Freq: Once | ORAL | Status: AC
  Administered 2019-03-25: 13:00:00 2000 mg via ORAL

## 2019-03-25 MED ORDER — GENTAMICIN SULFATE 40 MG/ML IJ SOLN
240.0000 mg | Freq: Once | INTRAMUSCULAR | Status: AC
  Administered 2019-03-25: 13:00:00 240 mg via INTRAMUSCULAR

## 2019-03-25 NOTE — Progress Notes (Signed)
    STI clinic/screening visit  Subjective:  Shawn Fowler is a 28 y.o. male being seen today for an STI screening visit. The patient reports they do have symptoms.  Patient has the following medical conditions:  There are no active problems to display for this patient.    Chief Complaint  Patient presents with  . SEXUALLY TRANSMITTED DISEASE    HPI  Patient reports he began having sympts 2 week ago.  He complains of: itching inside the head of penis and yellow/green disch form penis and on his underwear.  For 1 wk he has had intermittent pain with urination and erections and a red rash between upper inner thighs. Client states that he has had 1 partner x 1 yr.  He states his partner denies other sexual partners.  See flowsheet for further details and programmatic requirements.    The following portions of the patient's history were reviewed and updated as appropriate: allergies, current medications, past medical history, past social history, past surgical history and problem list.  Objective:  There were no vitals filed for this visit.  Physical Exam Constitutional:      Appearance: He is obese.  HENT:     Mouth/Throat:     Mouth: Mucous membranes are moist.     Pharynx: Oropharynx is clear. No oropharyngeal exudate or posterior oropharyngeal erythema.  Neck:     Musculoskeletal: Neck supple. No muscular tenderness.  Abdominal:     Palpations: Abdomen is soft.     Tenderness: There is no abdominal tenderness.  Genitourinary:    Scrotum/Testes: Normal.     Rectum: Normal.     Comments: Penis- thick, profuse yellow disch from urethra, no lesions noted, circ. No adenopathy Testicles no tenderness palpated, nl size, no erythema/swelling Lymphadenopathy:     Cervical: No cervical adenopathy.  Skin:    General: Skin is warm and dry.     Findings: No lesion or rash.  Neurological:     Mental Status: He is alert.    Assessment and Plan:  Shawn Fowler is a 28 y.o. male  presenting to the [facility] Department for STI screening  1. Screening examination for venereal disease - Gram stain - Chlamydia/Gonorrhea [facility] - Chlamydia/Gonorrhea [facility]  2. Exposure to STD Gonorrhea treatment: - gentamicin (GARAMYCIN) injection 240 mg - azithromycin (ZITHROMAX) tablet 2,000 mg  3. Gonorrhea - gentamicin (GARAMYCIN) injection 240 mg - azithromycin (ZITHROMAX) tablet 2,000 mg Co that that partner needs evaluation/treatment for GC. Co. No sexual activity x 1 wk and 1 wk after partner completes his treatment. Co. For client to have bloodwork for HIV,HIV and Hep B/C ASAP.   Referral made for the Prep appt. Condoms always for all sex.  Client took condoms today.    No follow-ups on file.  No future appointments.  Hassell Done, FNP

## 2019-03-25 NOTE — Progress Notes (Signed)
Here today for STD screening. Declines bloodwork. Hal Morales, RN  Gram Stain results reviewed. Patient treated for Gonorrhea per provider orders. Hal Morales, RN

## 2019-04-02 ENCOUNTER — Telehealth: Payer: Self-pay

## 2019-04-02 NOTE — Telephone Encounter (Signed)
TC to patient. Verified ID via password. Informed of positive GC and Chlamydia. Patient was tx'd in office that day; denies any vomiting. Instructed to have partner call for tx appt. Aileen Fass, RN

## 2019-04-16 ENCOUNTER — Ambulatory Visit: Payer: Self-pay | Admitting: Licensed Clinical Social Worker

## 2019-04-16 ENCOUNTER — Telehealth: Payer: Self-pay | Admitting: Licensed Clinical Social Worker

## 2019-04-16 NOTE — Telephone Encounter (Signed)
LCSW called to follow up on missed appointment. Another appointment was scheduled.

## 2019-04-30 ENCOUNTER — Ambulatory Visit: Payer: Self-pay | Admitting: Licensed Clinical Social Worker

## 2019-04-30 ENCOUNTER — Other Ambulatory Visit: Payer: Self-pay

## 2019-04-30 ENCOUNTER — Encounter: Payer: Self-pay | Admitting: Licensed Clinical Social Worker

## 2019-04-30 DIAGNOSIS — F411 Generalized anxiety disorder: Secondary | ICD-10-CM | POA: Insufficient documentation

## 2019-04-30 DIAGNOSIS — F4310 Post-traumatic stress disorder, unspecified: Secondary | ICD-10-CM | POA: Insufficient documentation

## 2019-04-30 DIAGNOSIS — F1210 Cannabis abuse, uncomplicated: Secondary | ICD-10-CM | POA: Insufficient documentation

## 2019-04-30 DIAGNOSIS — F902 Attention-deficit hyperactivity disorder, combined type: Secondary | ICD-10-CM | POA: Insufficient documentation
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
## 2019-05-11 ENCOUNTER — Telehealth: Payer: Self-pay | Admitting: Licensed Clinical Social Worker

## 2019-05-11 ENCOUNTER — Ambulatory Visit: Payer: Self-pay | Admitting: Licensed Clinical Social Worker

## 2019-05-11 NOTE — Telephone Encounter (Signed)
Called patient two times and left two messages in an effort to reach him for his Zoom appt.

## 2019-05-13 IMAGING — DX DG HAND COMPLETE 3+V*L*
3 series · 3 of 3 positions shown · non-contrast
Comparison: None.

CLINICAL DATA: Pain following fall

EXAM:
LEFT HAND - COMPLETE 3+ VIEW

[hand pa]
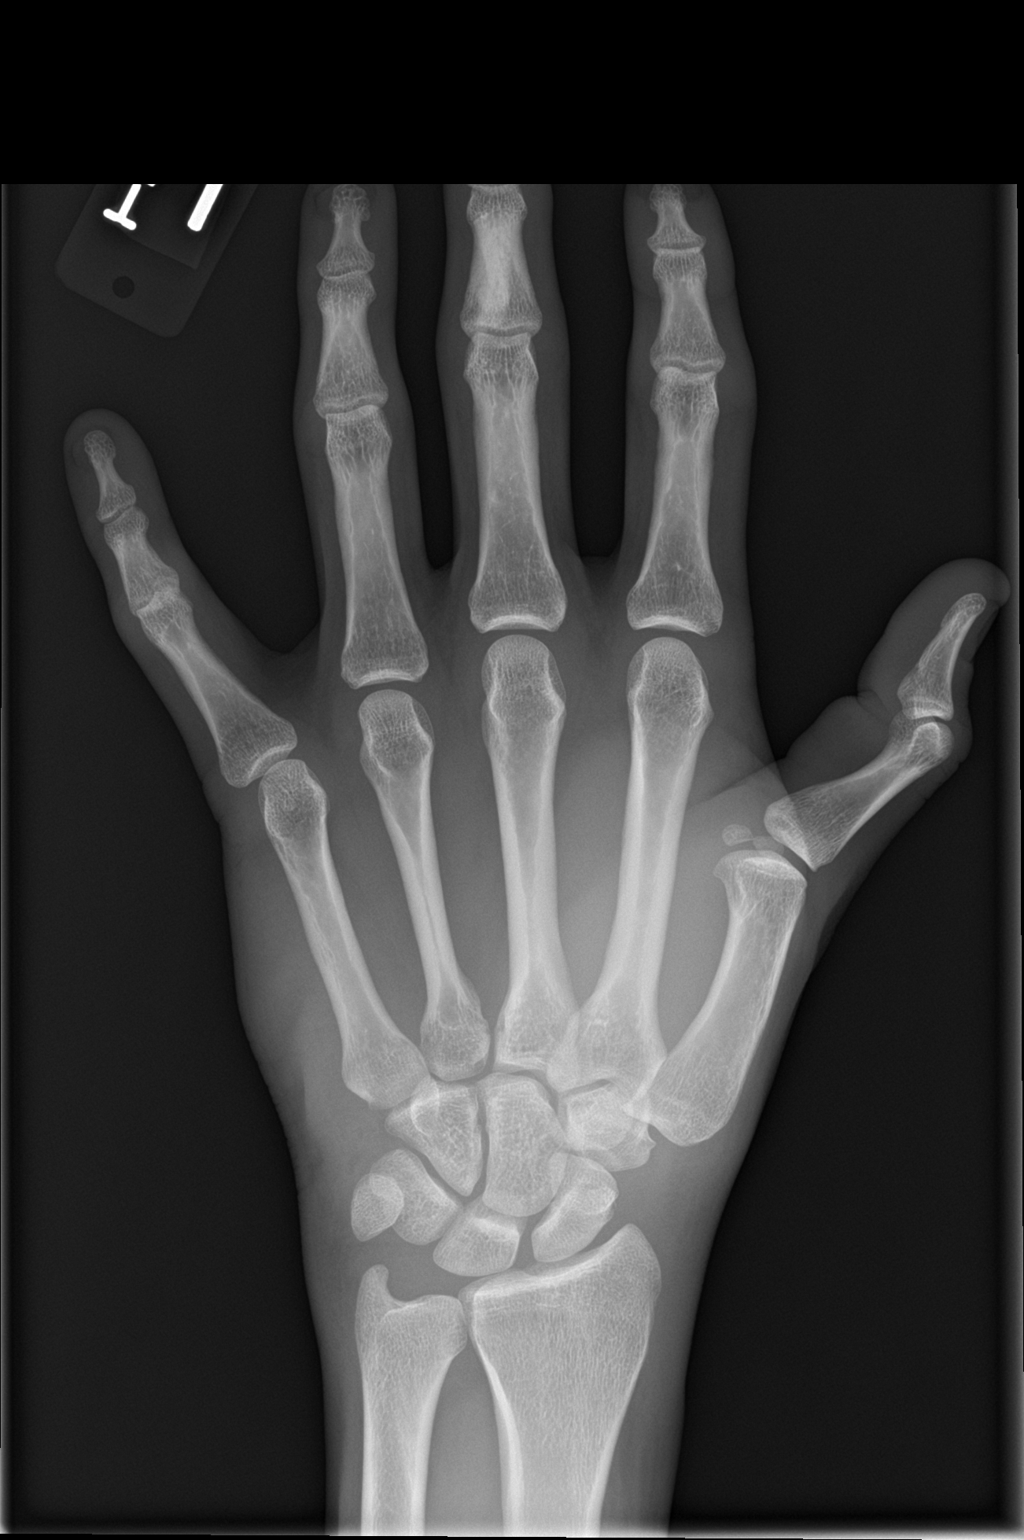

[hand obl]
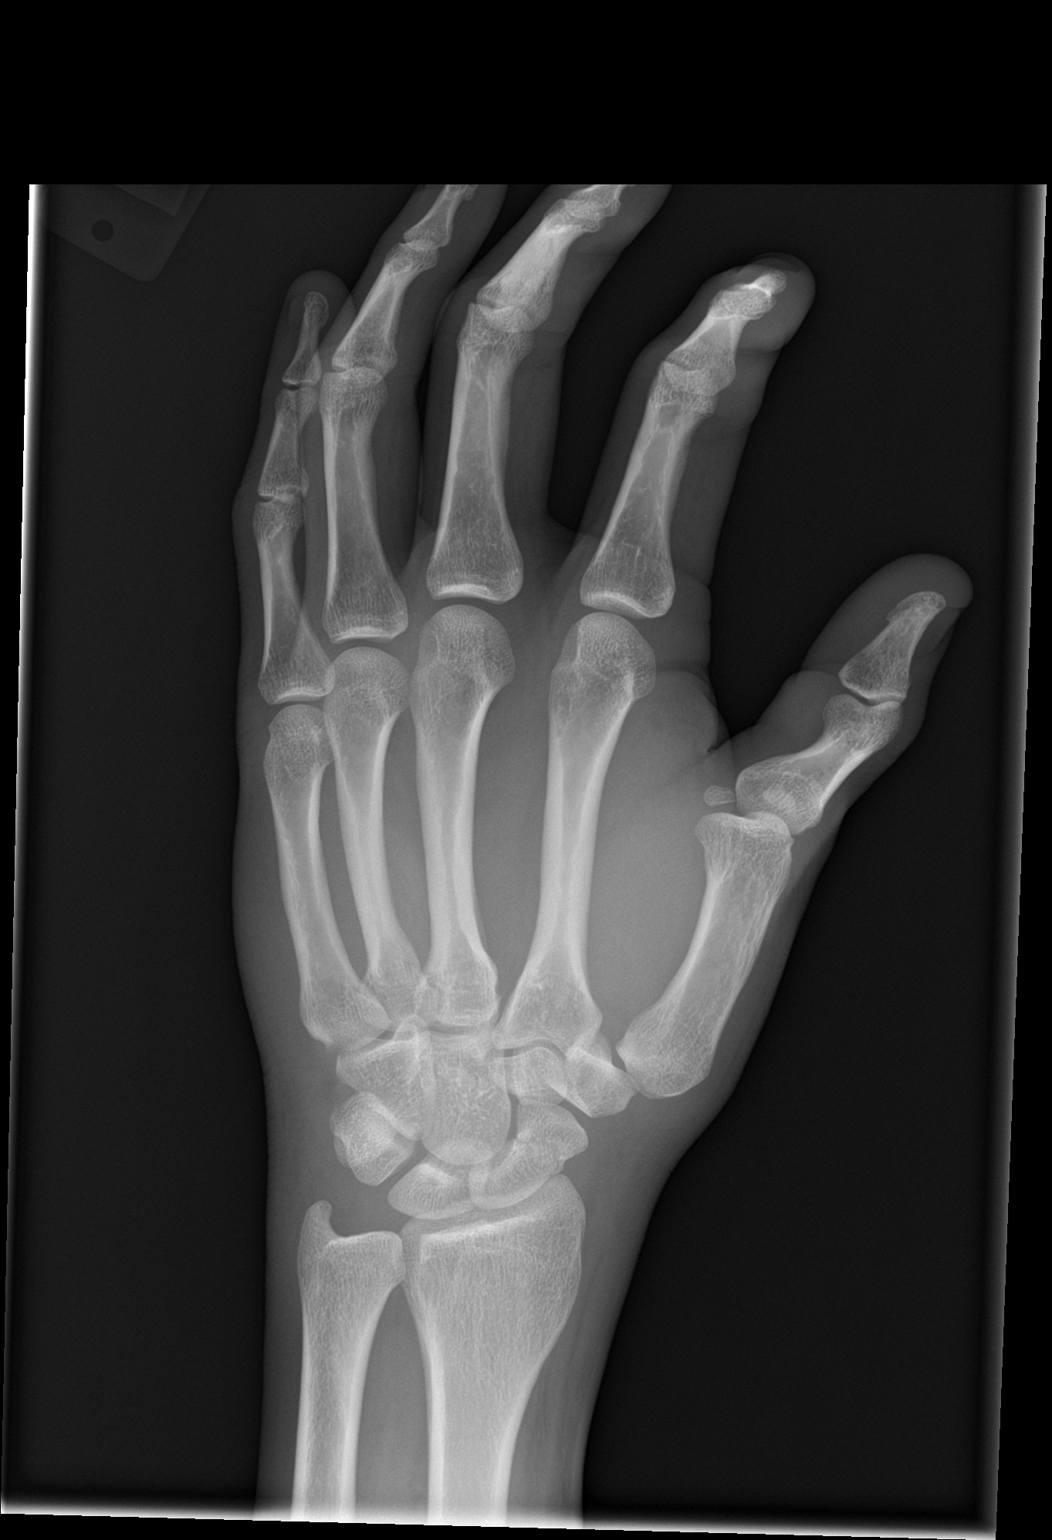

[hand lat]
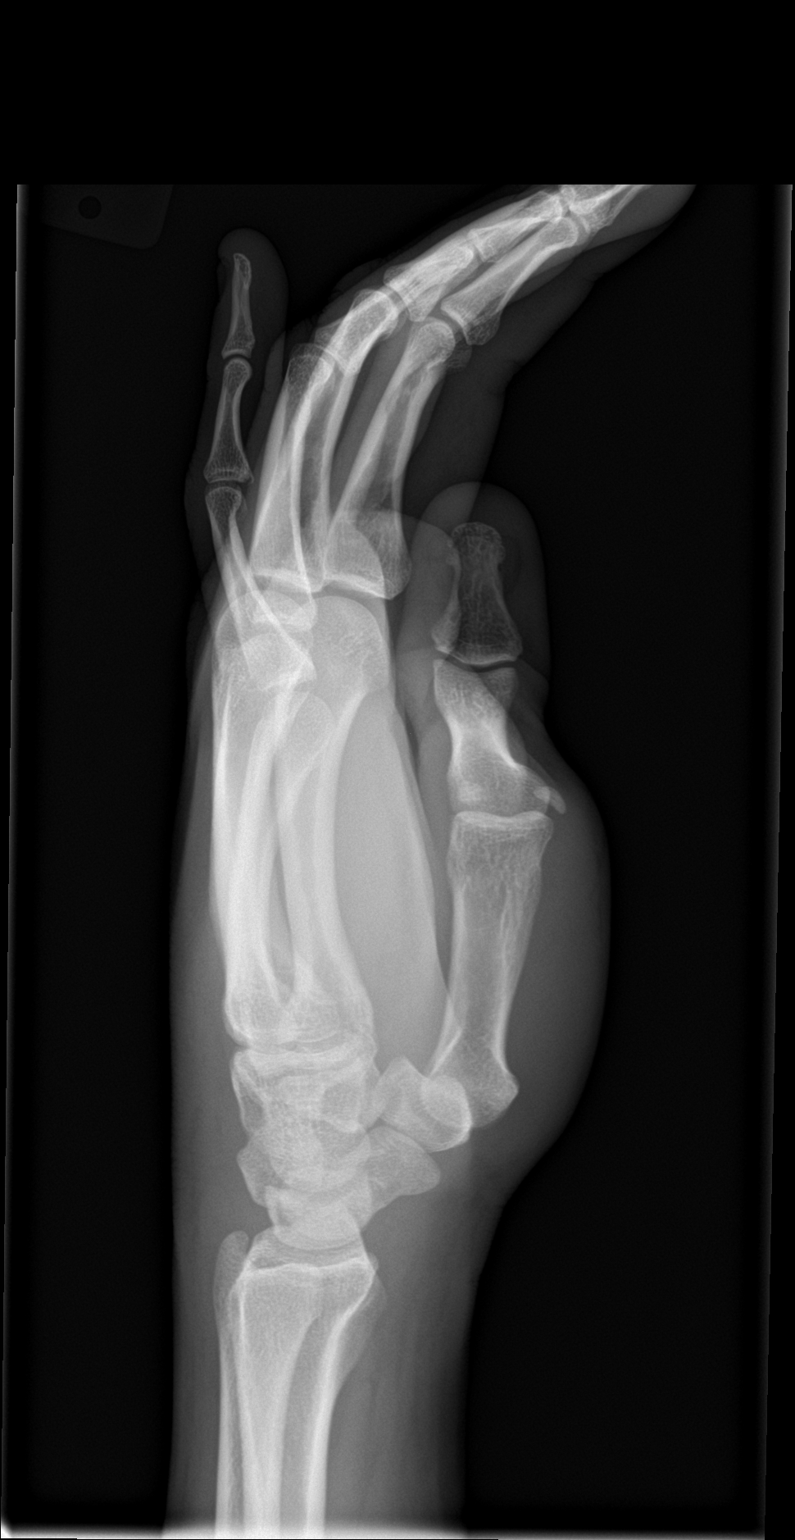

[3 of 3 positions shown; findings below may reference images not displayed]

FINDINGS: Frontal, oblique, and lateral views obtained. There is an apparent
fracture of the junction of the mid and distal thirds of the
scaphoid, best appreciated on the oblique view. No other evident
fracture. No dislocation. There is questionable subluxation at the
first MCP joint. There is no appreciable joint space narrowing or
erosion. There is a bone island in the third middle phalanx.
IMPRESSION: Apparent fracture at the junction mid and distal thirds of the
scaphoid bone, best seen on the oblique view. No other evident
fracture. Question subluxation at the first MCP joint. No frank
dislocation evident. No appreciable joint space narrowing or
erosion.

## 2019-06-26 ENCOUNTER — Other Ambulatory Visit: Payer: Self-pay | Admitting: *Deleted

## 2019-06-26 DIAGNOSIS — Z20822 Contact with and (suspected) exposure to covid-19: Secondary | ICD-10-CM

## 2019-06-28 ENCOUNTER — Telehealth: Payer: Self-pay | Admitting: *Deleted

## 2019-06-28 NOTE — Telephone Encounter (Signed)
Patient calling for COVID-19 results. Pt notified that results are still pending at this time.

## 2019-06-29 LAB — NOVEL CORONAVIRUS, NAA: SARS-CoV-2, NAA: NOT DETECTED

## 2019-09-15 ENCOUNTER — Other Ambulatory Visit: Payer: Self-pay

## 2019-09-15 ENCOUNTER — Ambulatory Visit: Payer: Self-pay | Attending: Internal Medicine

## 2019-09-15 DIAGNOSIS — Z20822 Contact with and (suspected) exposure to covid-19: Secondary | ICD-10-CM

## 2019-09-16 LAB — NOVEL CORONAVIRUS, NAA: SARS-CoV-2, NAA: NOT DETECTED

## 2020-05-23 ENCOUNTER — Other Ambulatory Visit: Payer: Self-pay

## 2020-05-23 ENCOUNTER — Other Ambulatory Visit: Payer: Self-pay | Admitting: *Deleted

## 2020-05-23 DIAGNOSIS — Z20822 Contact with and (suspected) exposure to covid-19: Secondary | ICD-10-CM

## 2020-05-24 LAB — SARS-COV-2, NAA 2 DAY TAT

## 2020-05-24 LAB — SPECIMEN STATUS REPORT

## 2020-05-24 LAB — NOVEL CORONAVIRUS, NAA: SARS-CoV-2, NAA: NOT DETECTED

## 2020-05-25 ENCOUNTER — Telehealth: Payer: Self-pay | Admitting: General Practice

## 2020-05-25 NOTE — Telephone Encounter (Signed)
Negative COVID results given. Patient results "NOT Detected." Caller expressed understanding. ° °

## 2020-09-09 ENCOUNTER — Other Ambulatory Visit: Payer: Medicaid Other

## 2020-12-29 ENCOUNTER — Telehealth: Payer: Self-pay

## 2020-12-29 NOTE — Telephone Encounter (Signed)
New Patient - No per Laury Axon

## 2021-08-16 ENCOUNTER — Other Ambulatory Visit: Payer: Self-pay

## 2021-08-16 ENCOUNTER — Encounter: Payer: Self-pay | Admitting: Emergency Medicine

## 2021-08-16 ENCOUNTER — Ambulatory Visit
Admission: EM | Admit: 2021-08-16 | Discharge: 2021-08-16 | Disposition: A | Discharge status: Home / Self Care | Payer: Medicaid Other | Attending: Family Medicine | Admitting: Family Medicine

## 2021-08-16 DIAGNOSIS — J309 Allergic rhinitis, unspecified: Secondary | ICD-10-CM

## 2021-08-16 DIAGNOSIS — J4521 Mild intermittent asthma with (acute) exacerbation: Secondary | ICD-10-CM

## 2021-08-16 MED ORDER — ALBUTEROL SULFATE HFA 108 (90 BASE) MCG/ACT IN AERS: 2.0000 | INHALATION_SPRAY | Freq: Four times a day (QID) | RESPIRATORY_TRACT | 2 refills | Status: DC | PRN

## 2021-08-16 MED ORDER — PREDNISONE 20 MG PO TABS: 40.0000 mg | ORAL_TABLET | Freq: Every day | ORAL | 0 refills | Status: DC

## 2021-08-16 MED ORDER — CETIRIZINE HCL 10 MG PO TABS: 10.0000 mg | ORAL_TABLET | Freq: Every day | ORAL | 2 refills | Status: DC

## 2021-08-16 MED ORDER — DEXAMETHASONE SODIUM PHOSPHATE 10 MG/ML IJ SOLN
10.0000 mg | Freq: Once | INTRAMUSCULAR | Status: AC
  Administered 2021-08-16: 10 mg via INTRAMUSCULAR

## 2021-08-16 MED ORDER — FLUTICASONE PROPIONATE 50 MCG/ACT NA SUSP
1.0000 | Freq: Two times a day (BID) | NASAL | 2 refills | Status: DC
Start: 2021-08-16 — End: 2021-12-05

## 2021-08-16 NOTE — ED Triage Notes (Signed)
Nasal congestion and chest congestion, productive cough with green sputum.

## 2021-08-16 NOTE — ED Provider Notes (Signed)
[facility]    CSN: [id number] Arrival date & time: 08/16/21  1641      History   Chief Complaint No chief complaint on file.   HPI Shawn Fowler is a 31 y.o. male.   Patient presenting today with about 3 weeks of persistent nasal congestion, sinus pressure, productive cough, chest congestion, wheezing, chest tightness, occasional shortness of breath.  He states his symptoms are worse overnight and first thing in the morning but after he coughs everything up and blows his nose numerous times in the morning he feels fairly well throughout the day.  He does have a history of asthma for which he used to take albuterol as needed but has not had a primary care provider in quite some time to get a refill on that.  Not taking anything over-the-counter for symptoms.  No known sick contacts recently.  Denies fever, chills, abdominal pain, nausea vomiting or diarrhea.   Past Medical History:  Diagnosis Date   ADHD    Asthma     Patient Active Problem List   Diagnosis Date Noted   Mild cannabis use disorder 04/30/2019   Generalized anxiety disorder 04/30/2019   Attention deficit hyperactivity disorder, combined type, By History  04/30/2019   Posttraumatic stress disorder 04/30/2019    Past Surgical History:  Procedure Laterality Date   MOUTH SURGERY     TONSILLECTOMY         Home Medications    Prior to Admission medications   Medication Sig Start Date End Date Taking? Authorizing Provider  albuterol (VENTOLIN HFA) 108 (90 Base) MCG/ACT inhaler Inhale 2 puffs into the lungs every 6 (six) hours as needed for wheezing or shortness of breath. 08/16/21  Yes Particia NearingLane, Elihue Ebert Elizabeth, PA-C  cetirizine (ZYRTEC ALLERGY) 10 MG tablet Take 1 tablet (10 mg total) by mouth daily. 08/16/21  Yes Particia NearingLane, Decker Cogdell Elizabeth, PA-C  fluticasone [facility](FLONASE) 50 MCG/ACT nasal spray Place 1 spray into both nostrils 2 (two) times daily. 08/16/21  Yes Particia NearingLane, Saahas Hidrogo Elizabeth, PA-C  predniSONE (DELTASONE)  20 MG tablet Take 2 tablets (40 mg total) by mouth daily with breakfast. 08/16/21  Yes Particia NearingLane, Rod Majerus Elizabeth, PA-C  clindamycin (CLEOCIN) 150 MG capsule Take 1 capsule (150 mg total) by mouth every 6 (six) hours. Patient not taking: Reported on 03/25/2019 07/18/18   Dione BoozeGlick, David, MD  HYDROcodone-acetaminophen (NORCO/VICODIN) 5-325 MG tablet Take 1 tablet by mouth every 4 (four) hours as needed. 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 --      Excl. in GC? --    No data found.  Updated Vital Signs BP 137/79 (BP Location: Right Arm)    Pulse 65    Temp 97.6 F (36.4 C) (Oral)    Resp 18    SpO2 95%   Visual Acuity Right Eye Distance:   Left Eye Distance:   Bilateral Distance:    Right Eye Near:   Left Eye Near:    Bilateral Near:     Physical Exam Vitals and nursing note reviewed.  Constitutional:      Appearance: He is well-developed.  HENT:     Head: Atraumatic.     Right Ear: External ear normal.     Left Ear:  External ear normal.     Nose: Rhinorrhea present.     Mouth/Throat:     Pharynx: Posterior oropharyngeal erythema present. No oropharyngeal exudate.  Eyes:     Conjunctiva/sclera: Conjunctivae normal.     Pupils: Pupils are equal, round, and reactive to light.  Cardiovascular:     Rate and Rhythm: Normal rate and regular rhythm.  Pulmonary:     Effort: Pulmonary effort is normal. No respiratory distress.     Breath sounds: Wheezing present. No rales.     Comments: Moderate diffuse wheezes bilaterally Musculoskeletal:        General: Normal range of motion.     Cervical back: Normal range of motion and neck supple.  Lymphadenopathy:     Cervical: No cervical adenopathy.  Skin:    General: Skin is warm and dry.  Neurological:     Mental Status: He is alert and oriented to person, place, and time.  Psychiatric:        Behavior: Behavior normal.     UC Treatments / Results  Labs (all labs ordered are listed, but only abnormal results are displayed) Labs Reviewed - No data to display  EKG   Radiology No results found.  Procedures Procedures (including critical care time)  Medications Ordered in UC Medications  dexamethasone (DECADRON) injection 10 mg (10 mg Intramuscular Given 08/16/21 1822)    Initial Impression / Assessment and Plan / UC Course  I have reviewed the triage vital signs and the nursing notes.  Pertinent labs & imaging results that were available during my care of the patient were reviewed by me and considered in my medical decision making (see chart for details).     Suspect uncontrolled seasonal allergies and asthma exacerbation.  Treat with IM Decadron in clinic, prednisone, albuterol inhaler, starting allergy regimen with Zyrtec and Flonase.  Discussed supportive care with sinus rinses, Sudafed as needed.  Return for acutely worsening symptoms.  Final Clinical Impressions(s) / UC Diagnoses   Final diagnoses:  Mild intermittent asthma with acute  exacerbation  Allergic sinusitis   Discharge Instructions   None    ED Prescriptions     Medication Sig Dispense Auth. Provider   predniSONE (DELTASONE) 20 MG tablet Take 2 tablets (40 mg total) by mouth daily with breakfast. 10 tablet Particia Nearing, PA-C   albuterol (VENTOLIN HFA) 108 (  90 Base) MCG/ACT inhaler Inhale 2 puffs into the lungs every 6 (six) hours as needed for wheezing or shortness of breath. 18 g Particia Nearing, [place]   cetirizine (ZYRTEC ALLERGY) 10 MG tablet Take 1 tablet (10 mg total) by mouth daily. 30 tablet Particia Nearing, PA-C   fluticasone [facility]) 50 MCG/ACT nasal spray Place 1 spray into both nostrils 2 (two) times daily. 16 g Particia Nearing, [place]      PDMP not reviewed this encounter.   Particia Nearing, [place] 08/16/21 1844

## 2021-09-15 ENCOUNTER — Ambulatory Visit (INDEPENDENT_AMBULATORY_CARE_PROVIDER_SITE_OTHER): Payer: 59 | Admitting: Nurse Practitioner

## 2021-09-15 ENCOUNTER — Other Ambulatory Visit: Payer: Self-pay

## 2021-09-15 ENCOUNTER — Encounter: Payer: Self-pay | Admitting: Nurse Practitioner

## 2021-09-15 VITALS — BP 142/80 | HR 82 | Ht 73.0 in | Wt 267.5 lb

## 2021-09-15 DIAGNOSIS — L720 Epidermal cyst: Secondary | ICD-10-CM

## 2021-09-15 DIAGNOSIS — R519 Headache, unspecified: Secondary | ICD-10-CM | POA: Diagnosis not present

## 2021-09-15 DIAGNOSIS — E669 Obesity, unspecified: Secondary | ICD-10-CM

## 2021-09-15 DIAGNOSIS — R0602 Shortness of breath: Secondary | ICD-10-CM

## 2021-09-15 DIAGNOSIS — J4540 Moderate persistent asthma, uncomplicated: Secondary | ICD-10-CM

## 2021-09-15 DIAGNOSIS — G8929 Other chronic pain: Secondary | ICD-10-CM

## 2021-09-15 DIAGNOSIS — F902 Attention-deficit hyperactivity disorder, combined type: Secondary | ICD-10-CM

## 2021-09-15 DIAGNOSIS — Z720 Tobacco use: Secondary | ICD-10-CM | POA: Insufficient documentation

## 2021-09-15 DIAGNOSIS — I10 Essential (primary) hypertension: Secondary | ICD-10-CM

## 2021-09-15 DIAGNOSIS — Z139 Encounter for screening, unspecified: Secondary | ICD-10-CM | POA: Diagnosis not present

## 2021-09-15 MED ORDER — BUDESONIDE-FORMOTEROL FUMARATE 80-4.5 MCG/ACT IN AERO: 2.0000 | INHALATION_SPRAY | Freq: Two times a day (BID) | RESPIRATORY_TRACT | 3 refills | Status: DC

## 2021-09-15 MED ORDER — IBUPROFEN 400 MG PO TABS: 400.0000 mg | ORAL_TABLET | Freq: Every day | ORAL | 0 refills | Status: DC | PRN

## 2021-09-15 MED ORDER — AMLODIPINE BESYLATE 2.5 MG PO TABS
2.5000 mg | ORAL_TABLET | Freq: Every day | ORAL | 1 refills | Status: DC
Start: 2021-09-15 — End: 2023-03-27

## 2021-09-15 MED ORDER — IBUPROFEN 400 MG PO TABS: 400.0000 mg | ORAL_TABLET | Freq: Four times a day (QID) | ORAL | 0 refills | Status: DC | PRN

## 2021-09-15 MED ORDER — AMLODIPINE BESYLATE 5 MG PO TABS: 5.0000 mg | ORAL_TABLET | Freq: Every day | ORAL | 1 refills | Status: DC

## 2021-09-15 MED ORDER — ALBUTEROL SULFATE HFA 108 (90 BASE) MCG/ACT IN AERS: 2.0000 | INHALATION_SPRAY | Freq: Four times a day (QID) | RESPIRATORY_TRACT | 2 refills | Status: AC | PRN

## 2021-09-15 NOTE — Assessment & Plan Note (Signed)
Due to uncontrolled asthma  Albuterol and Symbicort inhaler ordered.

## 2021-09-15 NOTE — Progress Notes (Signed)
New Patient Office Visit  Subjective:  Patient ID: Shawn Fowler, male    DOB: 1990/10/24  Age: 31 y.o. MRN: [id number]  CC:  Chief Complaint  Patient presents with   New Patient (Initial Visit)    NP sob at night has asthma went to urgent care around 08/15/21 dx with a sinus infection     HPI Shawn Fowler presents to establish. Previous PCP at [facility], and [facility], recent to [facility]  was in 2022.  Asthma. He is out of his inhaler , has been having symptoms everyday.  He went to the emergency room recently for asthma exacerbation.   Allergies. Flonase, cetirizine 10mg  daily patient not using both medications.  Headache . He feels a headache every single day,  uses BC powder, BC powder helps his headache.  Patient denies nausea or vomiting changes in his vision. States that OTC ibuprofen helps too. Pt told not to use bc powder at same time with ibuprofen he verbalized understanding.   ADHD.  Patient states that he is out of his Adderall, medication was last refilled at [facility], he has been out of this medication for months.   Patient is due for tetanus vaccine and flu vaccine.  HIV screening , Hep C screening.  Patient vapes  every day, he stated that he smokes cigarettes for 10 years quit 3 years ago and started vaping since then.  Patient educated on the need to quit vaping and verbalized understanding.  Patient complains of a bump on the scalp, he denies pain, patient states that the bump has been there for years now but it is getting bigger.  Past Medical History:  Diagnosis Date   ADHD    Asthma     Past Surgical History:  Procedure Laterality Date   MOUTH SURGERY     TONSILLECTOMY      Family History  Problem Relation Age of Onset   Bipolar disorder Mother     Social History   Socioeconomic History   Marital status: Single    Spouse name: NA   Number of children: 0   Years of education: Not on file   Highest  education level: Not on file  Occupational History   Occupation: unemployed   Tobacco Use   Smoking status: Every Day    Types: E-cigarettes   Smokeless tobacco: Never  Vaping Use   Vaping Use: Some days  Substance and Sexual Activity   Alcohol use: Yes    Comment: occasionally   Drug use: Yes    Types: Amphetamines, Marijuana    Comment: past use - sober for 8 months    Sexual activity: Not on file  Other Topics Concern   Not on file  Social History Narrative   Patient reports that he stays with his partner of 5 months most of the time, but occasionally stays with his grandparents. He reports that he has a supportive relationship with his partner and his grandparents and also has other positive relationships with family and a few friends. In addition, he reports that he has participated in recovery groups and currently is in one through social media, which he reports is beneficial.    Social Determinants of Health   Financial Resource Strain: Not on file  Food Insecurity: Not on file  Transportation Needs: Not on file  Physical Activity: Not on file  Stress: Not on file  Social Connections: Not on file  Intimate Partner Violence: Not on  file    ROS Review of Systems  Constitutional: Negative.   Respiratory:  Positive for shortness of breath and wheezing.   Cardiovascular: Negative.   Gastrointestinal: Negative.   Skin:        Cyst on the scalp  Psychiatric/Behavioral:         Has ADHD   Objective:   Today's Vitals: BP (!) 155/98 (BP Location: Left Arm, Cuff Size: Normal)    Pulse 82    Ht 6\' 1"  (1.854 m)    Wt 267 lb 8 oz (121.3 kg)    SpO2 92%    BMI 35.29 kg/m   Physical Exam Constitutional:      General: He is not in acute distress.    Appearance: He is obese. He is not ill-appearing, toxic-appearing or diaphoretic.  Cardiovascular:     Rate and Rhythm: Normal rate and regular rhythm.     Pulses: Normal pulses.     Heart sounds: Normal heart sounds. No  murmur heard.   No friction rub. No gallop.  Pulmonary:     Effort: Pulmonary effort is normal. No respiratory distress.     Breath sounds: No stridor. Wheezing present. No rhonchi.  Chest:     Chest wall: No tenderness.  Abdominal:     Palpations: Abdomen is soft.  Skin:    Comments: A small CYST noted on the scalp, feels rubbery to touch and mobile, non tender, skin is intact.   Neurological:     General: No focal deficit present.     Mental Status: He is alert and oriented to person, place, and time.     Cranial Nerves: No cranial nerve deficit.     Sensory: No sensory deficit.     Motor: No weakness.     Coordination: Coordination normal.     Gait: Gait normal.     Deep Tendon Reflexes: Reflexes normal.  Psychiatric:        Mood and Affect: Mood normal.        Behavior: Behavior normal.        Thought Content: Thought content normal.        Judgment: Judgment normal.    Assessment & Plan:   Problem List Items Addressed This Visit   None   Outpatient Encounter Medications as of 09/15/2021  Medication Sig   albuterol (VENTOLIN HFA) 108 (90 Base) MCG/ACT inhaler Inhale 2 puffs into the lungs every 6 (six) hours as needed for wheezing or shortness of breath.   fluticasone (FLONASE) 50 MCG/ACT nasal spray Place 1 spray into both nostrils 2 (two) times daily.   amphetamine-dextroamphetamine (ADDERALL) 30 MG tablet Adderall 30 mg tablet   cetirizine (ZYRTEC ALLERGY) 10 MG tablet Take 1 tablet (10 mg total) by mouth daily. (Patient not taking: Reported on 09/15/2021)   clindamycin (CLEOCIN) 150 MG capsule Take 1 capsule (150 mg total) by mouth every 6 (six) hours. (Patient not taking: Reported on 03/25/2019)   HYDROcodone-acetaminophen (NORCO/VICODIN) 5-325 MG tablet Take 1 tablet by mouth every 4 (four) hours as needed. (Patient not taking: Reported on 03/25/2019)   [DISCONTINUED] predniSONE (DELTASONE) 20 MG tablet Take 2 tablets (40 mg total) by mouth daily with breakfast. (Patient  not taking: Reported on 09/15/2021)   No facility-administered encounter medications on file as of 09/15/2021.    Follow-up: No follow-ups on file.   11/13/2021, FNP

## 2021-09-15 NOTE — Patient Instructions (Signed)
Place use Symbicort inhaler  2 puffs two times daily for your asthma Take albuterol inhaler every 6 hours as needed for your asthma.  Take ibuprofen 400mg  daily for your headache.  Take amlodipine 2.5mg  daily for your blood pressure.     It is important that you exercise regularly at least 30 minutes 5 times a week.  Think about what you will eat, plan ahead. Choose " clean, green, fresh or frozen" over canned, processed or packaged foods which are more sugary, salty and fatty. 70 to 75% of food eaten should be vegetables and fruit. Three meals at set times with snacks allowed between meals, but they must be fruit or vegetables. Aim to eat over a 12 hour period , example 7 am to 7 pm, and STOP after  your last meal of the day. Drink water,generally about 64 ounces per day, no other drink is as healthy. Fruit juice is best enjoyed in a healthy way, by EATING the fruit.  Thanks for choosing [facility], we consider it a privelige to serve you.

## 2021-09-15 NOTE — Assessment & Plan Note (Signed)
Will refer pt to psych for med management

## 2021-09-15 NOTE — Assessment & Plan Note (Addendum)
DASH diet and commitment to daily physical activity for a minimum of 30 minutes discussed and encouraged, as a part of hypertension management. The importance of attaining a healthy weight is also discussed.  BP/Weight 09/15/2021 08/16/2021 09/04/2018 07/18/2018 07/16/2018 06/21/2016 07/06/2015  Systolic BP 155 137 145 160 148 120 143  Diastolic BP 98 79 83 101 79 61 78  Wt. (Lbs) 267.5 - 240 240 220 296 330  BMI 35.29 - 30.81 32.55 28.25 38 42.35  EKG TODAY shows SR 70 BPM Start amlodipine 2.5mg  daily  follow up in 4 weeks

## 2021-09-15 NOTE — Assessment & Plan Note (Signed)
Weight: 267 lb 8 oz (121.3 kg)  Importance of healthy food choices with portion control discussed as well as eating regularly within 12  hour window.   The need to choose clean green food 50%-75% of time is discussed as well as make water the primary drink and set a goal for 64 ounces daily.  Patient reeducated about the importance of committment to minimum of 150 minutes of exercise per week.  Three meals at set times with snacks allowed between meals but they must be fruit or vegetable.   Aim to eat  over 12 hour period  for example 7 am to 7 pm. Stop after your last meal of the day.

## 2021-09-15 NOTE — Assessment & Plan Note (Signed)
Patient vapes  every day, he stated that he smokes cigarettes for 10 years quit 3 years ago and started vaping since then.  Patient educated on the need to quit vaping and verbalized understanding.

## 2021-09-15 NOTE — Assessment & Plan Note (Signed)
We will continue to monitor. Non tender, not erythematous.  Skin is intact

## 2021-09-15 NOTE — Assessment & Plan Note (Addendum)
RX ibuprofen 400mg  daily as needed educated on the need to eat before taking med, he verbalized understanding.

## 2021-09-15 NOTE — Assessment & Plan Note (Addendum)
RX Symbicort inhaler 2 puffs BID Albuterol inhaler PRN Patient educated on the need to use his cetrizine and Flonase  daily he verbalized understanding.

## 2021-09-20 ENCOUNTER — Telehealth: Payer: Self-pay | Admitting: *Deleted

## 2021-09-20 NOTE — Chronic Care Management (AMB) (Signed)
°  Care Management   Note  09/20/2021 Name: Shawn Fowler MRN: [id number] DOB: [birth year]  Shawn Fowler is a 31 y.o. year old male who is a primary care patient of Renee Rival, FNP. I reached out to Shawn Fowler by phone today in response to a referral sent by Mr. Shawn Fowler's primary care provider.   Shawn Fowler was given information about care management services today including:  Care management services include personalized support from designated clinical staff supervised by his physician, including individualized plan of care and coordination with other care providers 24/7 contact phone numbers for assistance for urgent and routine care needs. The patient may stop care management services at any time by phone call to the office staff.  Patient agreed to services and verbal consent obtained.   Follow up plan: Telephone appointment with care management team member scheduled for:10/27/21  [facility]  Direct Dial: [phone]

## 2021-10-13 ENCOUNTER — Ambulatory Visit (INDEPENDENT_AMBULATORY_CARE_PROVIDER_SITE_OTHER): Payer: 59 | Admitting: Nurse Practitioner

## 2021-10-13 ENCOUNTER — Encounter: Payer: Self-pay | Admitting: Nurse Practitioner

## 2021-10-13 ENCOUNTER — Other Ambulatory Visit: Payer: Self-pay

## 2021-10-13 VITALS — BP 138/62 | HR 85 | Ht 74.0 in | Wt 281.0 lb

## 2021-10-13 DIAGNOSIS — I10 Essential (primary) hypertension: Secondary | ICD-10-CM

## 2021-10-13 DIAGNOSIS — F411 Generalized anxiety disorder: Secondary | ICD-10-CM | POA: Diagnosis not present

## 2021-10-13 DIAGNOSIS — R519 Headache, unspecified: Secondary | ICD-10-CM

## 2021-10-13 DIAGNOSIS — J4540 Moderate persistent asthma, uncomplicated: Secondary | ICD-10-CM

## 2021-10-13 DIAGNOSIS — F419 Anxiety disorder, unspecified: Secondary | ICD-10-CM

## 2021-10-13 DIAGNOSIS — G8929 Other chronic pain: Secondary | ICD-10-CM

## 2021-10-13 MED ORDER — IBUPROFEN 400 MG PO TABS: 400.0000 mg | ORAL_TABLET | Freq: Every day | ORAL | 0 refills | Status: DC | PRN

## 2021-10-13 MED ORDER — ESCITALOPRAM OXALATE 5 MG PO TABS: 5.0000 mg | ORAL_TABLET | Freq: Every day | ORAL | 0 refills | Status: DC

## 2021-10-13 NOTE — Patient Instructions (Signed)
Start taking lexapro 5mg  daily for your anxiety and depression. ?Please start taking amlodipine 2.5mg  daily for your blood pressure.  ? ?It is important that you exercise regularly at least 30 minutes 5 times a week.  ?Think about what you will eat, plan ahead. ?Choose " clean, green, fresh or frozen" over canned, processed or packaged foods which are more sugary, salty and fatty. ?70 to 75% of food eaten should be vegetables and fruit. ?Three meals at set times with snacks allowed between meals, but they must be fruit or vegetables. ?Aim to eat over a 12 hour period , example 7 am to 7 pm, and STOP after  your last meal of the day. ?Drink water,generally about 64 ounces per day, no other drink is as healthy. Fruit juice is best enjoyed in a healthy way, by EATING the fruit. ? ?Thanks for choosing  [facility], we consider it a privelige to serve you. ? ?

## 2021-10-13 NOTE — Progress Notes (Signed)
? ?  Shawn Fowler     MRN: [id number]      DOB: [birth year] ? ? ?HPI ?Shawn Fowler with history of hypertension moderate persistent asthma complication, generalized anxiety disorder ADHD obesity is here for follow up for blood pressure. He took amlodipine 2.5mg  for 2 weeks and stopped taking the med due to feeling nauseous.  Patient denies dizziness, chest pain, syncope. ? ?Patient states that his asthma symptoms worse prior to his last visit, patient states that he has been using albuterol inhaler 2 puffs, Symbicort 2 puffs 2 times daily.  Patient denies wheezing shortness of breath cough chest pain ? ?Pt c/o intermittent chronic generalized headaches for 5 years,  states that he is bothered about his headache and would like a scan of his brain.  Patient denies denies nausea , vomiting , seizures numbness tingling ,changes in his vision. Gets HA evey other day.  He has been taking ibuprofen 400 mg daily as needed states that medication helps his headaches a little bit.  Patient is requesting for referral to a neurologist.  ? ?Patient complains of anxiety currently not on medication states that he used to take Klonopin in the past.  Patient denies SI, HI.  He has an upcoming appointment with the social worker for therapy and referred to psych ? ? ? ?ROS ?Denies recent fever or chills. ?Denies sinus pressure, nasal congestion, ear pain or sore throat. ?Denies chest congestion, productive cough or wheezing. ?Denies chest pains, palpitations and leg swelling ?Denies abdominal pain, nausea, vomiting,diarrhea or constipation.   ?Denies dysuria, frequency, hesitancy or incontinence. ?Denies joint pain, swelling and limitation in mobility. ?Denies  seizures, numbness, or tingling. ?Denies insomnia. ?Denies skin break down or rash. ? ? ?PE ? ?BP 138/62 (BP Location: Right Arm, Cuff Size: Large)   Pulse 85   Ht 6\' 2"  (1.88 m)   Wt 281 lb (127.5 kg)   SpO2 98%   BMI 36.08 kg/m?  ? ?Patient alert and oriented and in no  cardiopulmonary distress. ? ?HEENT: No facial asymmetry, EOMI,     Neck supple . ? ?Chest: Clear to auscultation bilaterally. ? ?CVS: S1, S2 no murmurs, no S3.Regular rate. ? ?ABD: Soft non tender.  ? ?MS: Adequate ROM spine, shoulders, hips and knees. ? ?Skin: Intact, no ulcerations or rash noted. ? ?Psych: Good eye contact, normal affect. Memory intact not anxious or depressed appearing. ? ?CNS: CN 2-12 intact, power,  normal throughout.no focal deficits noted. ? ? ?Assessment & Plan ?Generalized anxiety disorder ?GAD7 score -18 ?Denies SI, HI ?Start Lexapro 5 mg daily, follow-up in 6 weeks. ?Has upcoming appointment with social work for counseling and referral to psych. ? ?Chronic nonintractable headache ?Uncontrolled condition. ?Takes ibuprofen 400 mg daily as needed ?Patient referred to neurology. ? ?Hypertension ?BP Readings from Last 3 Encounters:  ?10/13/21 138/62  ?09/15/21 (!) 142/80  ?08/16/21 137/79  ?He has stopped taking amlodipine nausea ?Patient encouraged to restart amlodipine 2.5 mg daily, he verbalized understanding. ?DASH diet advised, increase exercise to lose weight. ?Monitor blood pressure at home blood pressure ? ?Moderate persistent asthma without complication ?Well-controlled on Symbicort 80-4 0.5 mcg/act inhaler. ?Albuterol 108 mcg/act inhaler ?Continue current medications.   ? ?

## 2021-10-14 DIAGNOSIS — F419 Anxiety disorder, unspecified: Secondary | ICD-10-CM | POA: Insufficient documentation

## 2021-10-14 NOTE — Assessment & Plan Note (Signed)
GAD7 score -18 ?Denies SI, HI ?Start Lexapro 5 mg daily, follow-up in 6 weeks. ?Has upcoming appointment with social work for counseling and referral to psych. ?

## 2021-10-14 NOTE — Assessment & Plan Note (Signed)
Well-controlled on Symbicort 80-4 0.5 mcg/act inhaler. ?Albuterol 108 mcg/act inhaler ?Continue current medications.  ?

## 2021-10-14 NOTE — Assessment & Plan Note (Signed)
BP Readings from Last 3 Encounters:  ?10/13/21 138/62  ?09/15/21 (!) 142/80  ?08/16/21 137/79  ?He has stopped taking amlodipine nausea ?Patient encouraged to restart amlodipine 2.5 mg daily, he verbalized understanding. ?DASH diet advised, increase exercise to lose weight. ?Monitor blood pressure at home blood pressure ?

## 2021-10-14 NOTE — Assessment & Plan Note (Signed)
Uncontrolled condition. ?Takes ibuprofen 400 mg daily as needed ?Patient referred to neurology. ?

## 2021-10-27 ENCOUNTER — Telehealth: Payer: 59

## 2021-10-27 ENCOUNTER — Telehealth: Payer: Self-pay | Admitting: Licensed Clinical Social Worker

## 2021-10-27 NOTE — Telephone Encounter (Signed)
?  Social Work Note: ? ?Shawn Fowler     MRN: [id number]      DOB: 06/02/1991 ? ?Shawn Fowler was referred to LCSW by Folashade R Paseda, FNP for counseling support ? ?LCSW called client phone number several times today but each time phone disconnected.  Thus, LCSW was not able to speak via phone today with client. Also, LCSW was not able to leave client phone message.  ? ?Follow Up Plan:  LCSW will contact scheduler Stacey Snead to see if she can reschedule LCSW phone call with client in the coming weeks ? ?[name] MSW, LCSW ?Licensed Clinical Social Worker ?[facility] ?[phone] ?

## 2021-10-27 NOTE — Telephone Encounter (Signed)
?  ?  Social Work Note: ? ?Shawn Fowler     MRN: [id number]      DOB: 05-13-1991 ? ?Marlowe Alt was referred to LCSW by Donell Beers, FNP for counseling support ? ?LCSW called client phone number several times today but each time phone disconnected.  Thus, LCSW was not able to speak via phone today with client. Also, LCSW was not able to leave client phone message.  ? ?Follow Up Plan:  LCSW will contact scheduler Gwenevere Ghazi to see if she can reschedule LCSW phone call with client in the coming weeks ? ?Kelton Pillar.Eulene Pekar MSW, LCSW ?Licensed Clinical Social Worker ?[facility] Care Management ?[phone] ?

## 2021-11-15 ENCOUNTER — Telehealth: Payer: Self-pay | Admitting: *Deleted

## 2021-11-15 NOTE — Chronic Care Management (AMB) (Signed)
?  Care Management  ? ?Note ? ?11/15/2021 ?Name: Shawn Fowler MRN: [id number] DOB: [birth year] ? ?Shawn Fowler is a 31 y.o. year old male who is a primary care patient of Donell Beers, FNP and is actively engaged with the care management team. I reached out to Marlowe Alt by phone today to assist with re-scheduling an initial visit with the Licensed Clinical Social Worker ? ?Follow up plan: ?Unsuccessful telephone outreach attempt made. A HIPAA compliant phone message was left for the patient providing contact information and requesting a return call.  ?The care management team will reach out to the patient again over the next 14 days.  ?If patient returns call to provider office, please advise to call Embedded Care Management Care Guide Misty Stanley at [phone]. ? ?Gwenevere Ghazi  ?Care Guide, Embedded Care Coordination ?[place]  Care Management  ?Direct Dial: [phone] ? ?

## 2021-11-19 ENCOUNTER — Other Ambulatory Visit: Payer: Self-pay | Admitting: Nurse Practitioner

## 2021-11-19 DIAGNOSIS — F411 Generalized anxiety disorder: Secondary | ICD-10-CM

## 2021-11-20 ENCOUNTER — Other Ambulatory Visit: Payer: Self-pay | Admitting: Nurse Practitioner

## 2021-11-20 DIAGNOSIS — F411 Generalized anxiety disorder: Secondary | ICD-10-CM

## 2021-11-20 MED ORDER — ESCITALOPRAM OXALATE 5 MG PO TABS: 5.0000 mg | ORAL_TABLET | Freq: Every day | ORAL | 0 refills | Status: DC

## 2021-11-21 ENCOUNTER — Ambulatory Visit: Payer: 59 | Admitting: Psychiatry

## 2021-11-21 NOTE — Progress Notes (Signed)
? ?Referring:  ?Donell Beers, FNP ?[address] ?[address] ?[place],  [place] [address] ? ?PCP: ?Donell Beers, FNP ? ?Neurology was asked to evaluate Shawn Fowler, a 31 year old male for a chief complaint of headaches.  Our recommendations of care will be communicated by shared medical record.   ? ?CC:  headaches ? ?History provided from self ? ?HPI:  ?Medical co-morbidities: asthma, ADHD, prior substance abuse ? ?The patient presents for evaluation of headaches which began 2-3 years ago. Headaches have been daily in this past year. They are described as R>L sharp/throbbing pain in his vertex and occiput. They is not associated with photophobia. At their worst he will have nausea and photophobia. They typically last 10-30 minutes at a time. He occasionally has numbness and tingling of his scalp. Will have aching and tenderness on his neck behind his ear.  ? ?He has noticed memory loss over the past year as well. Will frequently forget what he is doing when he walks into a room. Has also noticed trouble with his balance. Occasionally feels lightheaded, denies vertigo. No falls ? ? ?Headache History: ?Onset: 2-3 years ago ?Triggers: none ?Aura: no ?Location: R>L vertex ?Quality/Description: sharp, throbbing ?Associated Symptoms: ? Photophobia: no ? Phonophobia: yes ? Nausea: yes ?Worse with activity?: no ?Duration of headaches: 10-30 minutes ? ? ?Headache days per month: 30 ?Headache free days per month: 0 ? ?Current Treatment: ?Abortive ?ibuprofen ? ?Preventative ?none ? ?Prior Therapies                                 ?Lexapro 5 mg daily - nausea ?Amlodipine 2.5 mg daily ? ? ?LABS: ?none ? ? ?IMAGING:  ?CTH 2012: no acute intracranial process, bilateral maxillary sinus disease ? ? ?Current Outpatient Medications on File Prior to Visit  ?Medication Sig Dispense Refill  ? albuterol (VENTOLIN HFA) 108 (90 Base) MCG/ACT inhaler Inhale 2 puffs into the lungs every 6 (six) hours as needed for  wheezing or shortness of breath. 18 g 2  ? amLODipine (NORVASC) 2.5 MG tablet Take 1 tablet (2.5 mg total) by mouth daily. 30 tablet 1  ? budesonide-formoterol (SYMBICORT) 80-4.5 MCG/ACT inhaler Inhale 2 puffs into the lungs 2 (two) times daily. 1 each 3  ? cetirizine (ZYRTEC ALLERGY) 10 MG tablet Take 1 tablet (10 mg total) by mouth daily. 30 tablet 2  ? fluticasone (FLONASE) 50 MCG/ACT nasal spray Place 1 spray into both nostrils 2 (two) times daily. 16 g 2  ? ibuprofen (ADVIL) 400 MG tablet Take 1 tablet (400 mg total) by mouth daily as needed. 30 tablet 0  ? ?No current facility-administered medications on file prior to visit.  ? ? ? ?Allergies: ?Allergies  ?Allergen Reactions  ? Penicillins Anaphylaxis  ? Aspirin Swelling  ? ? ?Family History: ?Migraine or other headaches in the family:  sister ?Aneurysms in a first degree relative:  sister had a brain aneurysm ?Brain tumors in the family:  dad's side of the family has brain tumors ?Other neurological illness in the family:   none ? ?Past Medical History: ?Past Medical History:  ?Diagnosis Date  ? ADHD   ? Asthma   ? ? ?Past Surgical History ?Past Surgical History:  ?Procedure Laterality Date  ? MOUTH SURGERY    ? TONSILLECTOMY    ? WRIST SURGERY Left   ? ? ?Social History: ?Social History  ? ?Tobacco Use  ? Smoking  status: Former  ?  Packs/day: 1.00  ?  Years: 10.00  ?  Pack years: 10.00  ?  Types: E-cigarettes, Cigarettes  ?  Quit date: 09/16/2019  ?  Years since quitting: 2.1  ? Smokeless tobacco: Never  ? Tobacco comments:  ?  Started smoking at age 44, smoked one pack a day for 10 years. He picked up vaping after he quit cigarettes.   ?Vaping Use  ? Vaping Use: Some days  ? Devices: one cannister a week.  ?Substance Use Topics  ? Alcohol use: Yes  ?  Comment: occasionally  ? Drug use: Not Currently  ?  Types: Amphetamines, Marijuana, Cocaine, Other-see comments  ?  Comment: cocaine, heroine . pt states that he has been clean for 4 years  ? ? ?ROS: ?Negative  for fevers, chills. Positive for headaches, memory changes, imbalance. All other systems reviewed and negative unless stated otherwise in HPI. ? ? ?Physical Exam:  ? ?Vital Signs: ?BP (!) 150/85   Pulse 80   Ht 6\' 2"  (1.88 m)   Wt 283 lb (128.4 kg)   BMI 36.34 kg/m?  ?GENERAL: well appearing,in no acute distress,alert ?SKIN:  Color, texture, turgor normal. No rashes or lesions ?HEAD:  Normocephalic/atraumatic. ?CV:  RRR ?RESP: Normal respiratory effort ?MSK: +tenderness to palpation over R>L occiput, neck, and shoulders ? ?NEUROLOGICAL: ?Mental Status: Alert, oriented to person, place and time,Follows commands ?Cranial Nerves: PERRL, visual fields intact to confrontation, extraocular movements intact, facial sensation intact, no facial droop or ptosis, hearing grossly intact, no dysarthria ?Motor: muscle strength 5/5 both upper and lower extremities,no drift, normal tone ?Reflexes: 2+ throughout ?Sensation: intact to light touch all 4 extremities ?Coordination: Finger-to- nose-finger intact bilaterally ?Gait: normal-based ? ? ?IMPRESSION: ?31 year old male with a history of asthma, ADHD, prior substance abuse who presents for evaluation of daily headaches, memory changes, and imbalance. Will order MRI brain as well as MRA given first degree relative with cerebral aneurysm. His exam is suggestive of R>L occipital neuralgia. Will start Cymbalta for headache prevention and Robaxin as needed for neck tension. ? ?PLAN: ?-MRI, MRA brain ?-Preventive: Start Cymbalta 20 mg daily ?-Robaxin 500 mg TID PRN for muscle spasms/neck pain ?-Counseled on limiting ibuprofen use to 2 days per week to avoid rebound headaches ?-next steps: consider gabapentin, Topamax, neck PT ? ?I spent a total of 28 minutes chart reviewing and counseling the patient. Headache education was done. Discussed treatment options including preventive and acute medications. Discussed medication overuse headache and to limit use of acute treatments to no  more than 2 days/week or 10 days/month. Discussed medication side effects, adverse reactions and drug interactions. Written educational materials and patient instructions outlining all of the above were given. ? ?Follow-up: 4 months ? ? ?26, MD ?11/22/2021   ?2:37 PM ? ? ?

## 2021-11-22 ENCOUNTER — Telehealth: Payer: Self-pay | Admitting: Psychiatry

## 2021-11-22 ENCOUNTER — Ambulatory Visit (INDEPENDENT_AMBULATORY_CARE_PROVIDER_SITE_OTHER): Payer: 59 | Admitting: Psychiatry

## 2021-11-22 ENCOUNTER — Encounter: Payer: Self-pay | Admitting: Psychiatry

## 2021-11-22 VITALS — BP 150/85 | HR 80 | Ht 74.0 in | Wt 283.0 lb

## 2021-11-22 DIAGNOSIS — M5481 Occipital neuralgia: Secondary | ICD-10-CM

## 2021-11-22 DIAGNOSIS — R519 Headache, unspecified: Secondary | ICD-10-CM

## 2021-11-22 MED ORDER — DULOXETINE HCL 20 MG PO CPEP: 20.0000 mg | ORAL_CAPSULE | Freq: Every day | ORAL | 3 refills | Status: DC

## 2021-11-22 MED ORDER — METHOCARBAMOL 500 MG PO TABS: 500.0000 mg | ORAL_TABLET | Freq: Three times a day (TID) | ORAL | 3 refills | Status: DC | PRN

## 2021-11-22 NOTE — Telephone Encounter (Signed)
[employer] pending faxed notes 

## 2021-11-22 NOTE — Patient Instructions (Addendum)
MRI and MRA of the brain ?Start Cymbalta (duloxetine) daily for headache prevention ?Start Robaxin (methocarbamol) as needed for neck tension and headaches. This can make you sleepy so try it at night first ?

## 2021-11-27 NOTE — Telephone Encounter (Signed)
LVM for pt to call back to schedule  ? ?[employer] auth: [id number] (exp. 11/22/21 to 02/21/22)  ?

## 2021-11-27 NOTE — Chronic Care Management (AMB) (Signed)
?  Care Management  ? ?Note ? ?11/27/2021 ?Name: HASANI MENDELSON MRN: [id number] DOB: 1991-02-24 ? ?ABED CROXFORD is a 31 y.o. year old male who is a primary care patient of Renee Rival, FNP and is actively engaged with the care management team. I reached out to Alphia Moh by phone today to assist with re-scheduling an initial visit with the Licensed Clinical Social Worker ? ?Follow up plan: ?Unsuccessful telephone outreach attempt made. A HIPAA compliant phone message was left for the patient providing contact information and requesting a return call.  ?The care management team will reach out to the patient again over the next 7 days.  ?If patient returns call to provider office, please advise to call [place] at [phone]. ? ?Laverda Sorenson  ?Care Guide, Embedded Care Coordination ?[place]  Care Management  ?Direct Dial: [phone] ? ?

## 2021-12-05 ENCOUNTER — Telehealth: Payer: Self-pay

## 2021-12-05 ENCOUNTER — Other Ambulatory Visit: Payer: Self-pay

## 2021-12-05 MED ORDER — FLUTICASONE PROPIONATE 50 MCG/ACT NA SUSP: 1.0000 | Freq: Two times a day (BID) | NASAL | 2 refills | Status: DC

## 2021-12-05 NOTE — Telephone Encounter (Signed)
Completed pt aware

## 2021-12-05 NOTE — Chronic Care Management (AMB) (Signed)
?  Care Management  ? ?Note ? ?12/05/2021 ?Name: Shawn Fowler MRN: [id number] DOB: 1991/02/21 ? ?Shawn Fowler is a 31 y.o. year old male who is a primary care patient of Donell Beers, FNP and is actively engaged with the care management team. I reached out to Marlowe Alt by phone today to assist with re-scheduling an initial visit with the Licensed Clinical Social Worker ? ?Follow up plan: ?Telephone appointment with care management team member scheduled for:12/27/21 ? ?Gwenevere Ghazi  ?Care Guide, Embedded Care Coordination ?[place]  Care Management  ?Direct Dial: [phone] ? ?

## 2021-12-05 NOTE — Telephone Encounter (Signed)
Patient called need refill, no longer gets from other pcp ? ?fluticasone (FLONASE) 50 MCG/ACT nasal spray ? ?Pharmacy: [facility] [place] ?

## 2021-12-12 ENCOUNTER — Encounter: Payer: 59 | Admitting: Nurse Practitioner

## 2021-12-26 ENCOUNTER — Encounter (HOSPITAL_COMMUNITY): Payer: Self-pay | Admitting: Emergency Medicine

## 2021-12-26 ENCOUNTER — Ambulatory Visit (HOSPITAL_COMMUNITY)
Admission: EM | Admit: 2021-12-26 | Discharge: 2021-12-26 | Disposition: A | Discharge status: Home / Self Care | Payer: 59 | Attending: Internal Medicine | Admitting: Internal Medicine

## 2021-12-26 DIAGNOSIS — K047 Periapical abscess without sinus: Secondary | ICD-10-CM | POA: Diagnosis not present

## 2021-12-26 DIAGNOSIS — K0889 Other specified disorders of teeth and supporting structures: Secondary | ICD-10-CM

## 2021-12-26 MED ORDER — CLINDAMYCIN HCL 150 MG PO CAPS: 450.0000 mg | ORAL_CAPSULE | Freq: Three times a day (TID) | ORAL | 0 refills | Status: AC

## 2021-12-26 NOTE — ED Triage Notes (Signed)
Pt reports lower dental pain x 1 month. States he was going to get teeth removed and dentist advised him to get antibiotics from UC.  ?

## 2021-12-26 NOTE — Discharge Instructions (Signed)
You have been prescribed antibiotic for dental infection.  Please follow-up with dentist for further evaluation and management. 

## 2021-12-26 NOTE — ED Provider Notes (Signed)
?[facility] ? ? ? ?CSN: [id number] ?Arrival date & time: 12/26/21  1138 ? ? ?  ? ?History   ?Chief Complaint ?Chief Complaint  ?Patient presents with  ? Dental Pain  ? ? ?HPI ?Shawn Fowler is a 31 y.o. male.  ? ?Patient presents with dental pain to lower portion of teeth that has been present for approximately 1 month.  Patient reports that he went to the dentist this morning at approximately 11 AM to get teeth removed but they would not see him as they advised him that he needs antibiotics prior to being seen.  Therefore he was advised to come to urgent care.  He has taken over-the-counter pain relievers with no improvement of symptoms, and patient reports that he took an antibiotic that a friend gave him that he is not sure the name of that provided some improvement.  Denies any associated documented fevers. ? ?Patient has elevated blood pressure reading but nursing staff reported that he would not still still during blood pressure cuff taking blood pressure.  Patient does not report chest pain, shortness of breath, headache, blurred vision, dizziness, nausea, vomiting. ? ? ?Dental Pain ? ?Past Medical History:  ?Diagnosis Date  ? ADHD   ? Asthma   ? ? ?Patient Active Problem List  ? Diagnosis Date Noted  ? Anxiety 10/14/2021  ? Hypertension 09/15/2021  ? Chronic nonintractable headache 09/15/2021  ? Moderate persistent asthma without complication 09/15/2021  ? Epidermoid cyst of skin of scalp 09/15/2021  ? Shortness of breath 09/15/2021  ? Vapes nicotine containing substance 09/15/2021  ? Obesity (BMI 35.0-39.9 without comorbidity) 09/15/2021  ? Mild cannabis use disorder 04/30/2019  ? Generalized anxiety disorder 04/30/2019  ? Attention deficit hyperactivity disorder, combined type, By History  04/30/2019  ? Posttraumatic stress disorder 04/30/2019  ? ? ?Past Surgical History:  ?Procedure Laterality Date  ? MOUTH SURGERY    ? TONSILLECTOMY    ? WRIST SURGERY Left   ? ? ? ? ? ?Home Medications    ? ?Prior to Admission medications   ?Medication Sig Start Date End Date Taking? Authorizing Provider  ?clindamycin (CLEOCIN) 150 MG capsule Take 3 capsules (450 mg total) by mouth 3 (three) times daily for 5 days. 12/26/21 12/31/21 Yes Brittnie Lewey, Acie FredricksonHaley E, FNP  ?albuterol (VENTOLIN HFA) 108 (90 Base) MCG/ACT inhaler Inhale 2 puffs into the lungs every 6 (six) hours as needed for wheezing or shortness of breath. 09/15/21   Donell BeersPaseda, Folashade R, FNP  ?amLODipine (NORVASC) 2.5 MG tablet Take 1 tablet (2.5 mg total) by mouth daily. 09/15/21   Donell BeersPaseda, Folashade R, FNP  ?budesonide-formoterol (SYMBICORT) 80-4.5 MCG/ACT inhaler Inhale 2 puffs into the lungs 2 (two) times daily. 09/15/21   Donell BeersPaseda, Folashade R, FNP  ?cetirizine (ZYRTEC ALLERGY) 10 MG tablet Take 1 tablet (10 mg total) by mouth daily. 08/16/21   Particia NearingLane, Rachel Elizabeth, PA-C  ?DULoxetine (CYMBALTA) 20 MG capsule Take 1 capsule (20 mg total) by mouth daily. 11/22/21   Ocie Doynehima, Jennifer, MD  ?fluticasone (FLONASE) 50 MCG/ACT nasal spray Place 1 spray into both nostrils 2 (two) times daily. 12/05/21   Donell BeersPaseda, Folashade R, FNP  ?ibuprofen (ADVIL) 400 MG tablet Take 1 tablet (400 mg total) by mouth daily as needed. 10/13/21   Donell BeersPaseda, Folashade R, FNP  ?methocarbamol (ROBAXIN) 500 MG tablet Take 1 tablet (500 mg total) by mouth every 8 (eight) hours as needed for muscle spasms (headaches). 11/22/21   Ocie Doynehima, Jennifer, MD  ? ? ?Family History ?Family  History  ?Problem Relation Age of Onset  ? Bipolar disorder Mother   ? Heart Problems Mother   ? Heart Problems Father   ? Kidney disease Maternal Uncle   ? Heart Problems Maternal Grandmother   ? Lung cancer Maternal Grandmother   ? Heart Problems Maternal Grandfather   ? Heart Problems Paternal Grandmother   ? Heart Problems Paternal Grandfather   ?     Passed from brain tumor  ? Cancer - Prostate Neg Hx   ? Colon cancer Neg Hx   ? ? ?Social History ?Social History  ? ?Tobacco Use  ? Smoking status: Former  ?  Packs/day: 1.00  ?  Years:  10.00  ?  Pack years: 10.00  ?  Types: E-cigarettes, Cigarettes  ?  Quit date: 09/16/2019  ?  Years since quitting: 2.2  ? Smokeless tobacco: Never  ? Tobacco comments:  ?  Started smoking at age 29, smoked one pack a day for 10 years. He picked up vaping after he quit cigarettes.   ?Vaping Use  ? Vaping Use: Some days  ? Devices: one cannister a week.  ?Substance Use Topics  ? Alcohol use: Yes  ?  Comment: occasionally  ? Drug use: Not Currently  ?  Types: Amphetamines, Marijuana, Cocaine, Other-see comments  ?  Comment: cocaine, heroine . pt states that he has been clean for 4 years  ? ? ? ?Allergies   ?Penicillins and Aspirin ? ? ?Review of Systems ?Review of Systems ?Per HPI ? ?Physical Exam ?Triage Vital Signs ?ED Triage Vitals [12/26/21 1200]  ?Enc Vitals Group  ?   BP (!) 174/96  ?   Pulse Rate 76  ?   Resp 18  ?   Temp 97.6 ?F (36.4 ?C)  ?   Temp Source Oral  ?   SpO2 97 %  ?   Weight 283 lb 1.1 oz (128.4 kg)  ?   Height 6\' 2"  (1.88 m)  ?   Head Circumference   ?   Peak Flow   ?   Pain Score 10  ?   Pain Loc   ?   Pain Edu?   ?   Excl. in GC?   ? ?No data found. ? ?Updated Vital Signs ?BP (!) 174/96 (BP Location: Right Arm) Comment: pt unable to sit still  Pulse 76   Temp 97.6 ?F (36.4 ?C) (Oral)   Resp 18   Ht 6\' 2"  (1.88 m)   Wt 283 lb 1.1 oz (128.4 kg)   SpO2 97%   BMI 36.34 kg/m?  ? ?Visual Acuity ?Right Eye Distance:   ?Left Eye Distance:   ?Bilateral Distance:   ? ?Right Eye Near:   ?Left Eye Near:    ?Bilateral Near:    ? ?Physical Exam ?Constitutional:   ?   General: He is not in acute distress. ?   Appearance: Normal appearance. He is not toxic-appearing or diaphoretic.  ?HENT:  ?   Head: Normocephalic and atraumatic.  ?   Mouth/Throat:  ?   Dentition: Abnormal dentition. Dental tenderness, gingival swelling and dental caries present.  ?   Comments: Gingival swelling and erythema located to front bottom dentition and gingiva. multiple broken teeth noted. ?Eyes:  ?   Extraocular Movements:  Extraocular movements intact.  ?   Conjunctiva/sclera: Conjunctivae normal.  ?   Pupils: Pupils are equal, round, and reactive to light.  ?Cardiovascular:  ?   Rate and Rhythm: Normal rate and regular  rhythm.  ?   Pulses: Normal pulses.  ?   Heart sounds: Normal heart sounds.  ?Pulmonary:  ?   Effort: Pulmonary effort is normal. No respiratory distress.  ?   Breath sounds: Normal breath sounds.  ?Neurological:  ?   General: No focal deficit present.  ?   Mental Status: He is alert and oriented to person, place, and time. Mental status is at baseline.  ?   Cranial Nerves: Cranial nerves 2-12 are intact.  ?   Sensory: Sensation is intact.  ?   Motor: Motor function is intact.  ?   Coordination: Coordination is intact.  ?   Gait: Gait is intact.  ?Psychiatric:     ?   Mood and Affect: Mood normal.     ?   Behavior: Behavior normal.     ?   Thought Content: Thought content normal.     ?   Judgment: Judgment normal.  ? ? ? ?UC Treatments / Results  ?Labs ?(all labs ordered are listed, but only abnormal results are displayed) ?Labs Reviewed - No data to display ? ?EKG ? ? ?Radiology ?No results found. ? ?Procedures ?Procedures (including critical care time) ? ?Medications Ordered in UC ?Medications - No data to display ? ?Initial Impression / Assessment and Plan / UC Course  ?I have reviewed the triage vital signs and the nursing notes. ? ?Pertinent labs & imaging results that were available during my care of the patient were reviewed by me and considered in my medical decision making (see chart for details). ? ?  ? ?Will treat dental infection and pain with clindamycin given patient's penicillin allergy.  Patient advised to follow-up with dentist for further evaluation and management.  Discussed supportive care with patient.  Patient has elevated blood pressure reading in urgent care today but pain could be contributing.  Also, patient cannot sit still while blood pressure cuff was taking blood pressure so this may not  be accurate.  Patient to monitor blood pressure at home and follow-up with PCP if it remains elevated.  No concern for hypertensive urgency on exam.  Discussed return precautions.  Patient verbalized understanding and was ag

## 2021-12-27 ENCOUNTER — Ambulatory Visit: Payer: 59 | Admitting: Licensed Clinical Social Worker

## 2021-12-27 DIAGNOSIS — F411 Generalized anxiety disorder: Secondary | ICD-10-CM

## 2021-12-27 DIAGNOSIS — F902 Attention-deficit hyperactivity disorder, combined type: Secondary | ICD-10-CM

## 2021-12-27 NOTE — Chronic Care Management (AMB) (Signed)
? ?   Clinical Social Work  ?Care Management  ? Phone Outreach  ? ? ?12/27/2021 ?Name: Shawn Fowler MRN: [id number] DOB: Jun 03, 1991 ? ?Shawn Fowler is a 31 y.o. year old male who is a primary care patient of Donell Beers, FNP .  ? ?Reason for referral:  psychotherapy and medication evaluation  .   ? ?CCM LCSW reached out to patient today by phone to introduce self, assess needs and offer Care Management services and interventions.    Patient was at work and unable to keep phone appointment today and requested to reschedule. He works on [employer] and unable to have appointments on these days unless late in the afternoon.  ? ?Plan:Appointment was rescheduled with CCM LCSW on May 23rd at 11:30 ? ?Review of patient status, including review of consultants reports, relevant laboratory and other test results, and collaboration with appropriate care team members and the patient's provider was performed as part of comprehensive patient evaluation and provision of care management services.   ? ?Sammuel Hines, LCSW ?Licensed Clinical Social Worker [facility]  ?[facility] ?[phone]  ? ? ?  ? ? ?  ?

## 2021-12-27 NOTE — Patient Instructions (Signed)
? ? ? ?  I am sorry you were unable to keep your phone appointment today.   ?per your request your appointment is scheduled May 23rd at 11:30 ?Please call the office if needed ? ?Casimer Lanius, LCSW ?Licensed Clinical Social Worker [facility]  ?[place] ?[phone]  ?

## 2022-01-02 ENCOUNTER — Telehealth: Payer: Self-pay | Admitting: Licensed Clinical Social Worker

## 2022-01-02 ENCOUNTER — Telehealth: Payer: 59

## 2022-01-02 NOTE — Chronic Care Management (AMB) (Signed)
    Clinical Social Work  Care Management   Phone Outreach    01/02/2022 Name: Shawn Fowler MRN: [id number] DOB: 1991/04/10  Shawn Fowler is a 31 y.o. year old male who is a primary care patient of Donell Beers, FNP .   Reason for referral: Mental Health Counseling and Resources.    CCM LCSW reached out to patient today by phone to introduce self, assess needs and offer Care Management services and interventions.    Telephone outreach was unsuccessful. A HIPPA compliant phone message was left for the patient providing contact information and requesting a return call.   Plan:CCM LCSW will wait for return call. If no return call is received, Will route chart to Care Guide to see if patient would like to reschedule phone appointment   Review of patient status, including review of consultants reports, relevant laboratory and other test results, and collaboration with appropriate care team members and the patient's provider was performed as part of comprehensive patient evaluation and provision of care management services.    Sammuel Hines, LCSW Licensed Clinical Social Worker [facility]  [facility] [phone]

## 2022-01-05 ENCOUNTER — Other Ambulatory Visit: Payer: Self-pay | Admitting: Psychiatry

## 2022-01-05 MED ORDER — LORAZEPAM 0.5 MG PO TABS: ORAL_TABLET | ORAL | 0 refills | Status: DC

## 2022-01-05 NOTE — Telephone Encounter (Signed)
75 mins MRI Brain w/wo & MRA head wo Dr. Delena Bali [employer] auth: [id number] exp. 11/22/21-02/21/22 scheduled at [facility] 01/17/22 at 3pm.  Patient would like meds for the scan please

## 2022-01-05 NOTE — Telephone Encounter (Signed)
Rx for ativan sent to his pharmacy, thanks

## 2022-01-17 ENCOUNTER — Other Ambulatory Visit: Payer: 59

## 2022-01-24 ENCOUNTER — Ambulatory Visit: Payer: 59 | Admitting: Licensed Clinical Social Worker

## 2022-01-24 ENCOUNTER — Telehealth: Payer: Self-pay

## 2022-01-24 DIAGNOSIS — F902 Attention-deficit hyperactivity disorder, combined type: Secondary | ICD-10-CM

## 2022-01-24 DIAGNOSIS — F411 Generalized anxiety disorder: Secondary | ICD-10-CM

## 2022-01-24 NOTE — Chronic Care Management (AMB) (Signed)
Care Management Clinical Social Work Note  01/24/2022 Name: Shawn Fowler MRN: [id number] DOB: 03-13-1991  Shawn Fowler is a 31 y.o. year old male who is a primary care patient of Donell Beers, FNP.  The Care Management team was consulted for assistance with chronic disease management and coordination needs.  Engaged with patient by telephone for initial visit in response to provider referral for social work chronic care management and care coordination services  Consent to Services:  Shawn Fowler was given information about Care Management services today including:  Care Management services includes personalized support from designated clinical staff supervised by his physician, including individualized plan of care and coordination with other care providers 24/7 contact phone numbers for assistance for urgent and routine care needs. The patient may stop case management services at any time by phone call to the office staff.  Patient agreed to services and consent obtained.   Summary: Assessed patient's previous and current treatment, coping skills, support system and barriers to care. Patient continues to experience difficulty with daily function due to not being on his ADHD medication. Reports being on medication all of his life and PCP will no longer refill medication..  See Care Plan below for interventions and patient self-care actives.  Recommendation: Patient may benefit from, and is in agreement to call provider discussed today to schedule appointment for medication evaluation and therapy.   Follow up Plan: Patient would like continued follow-up from CCM LCSW.  per patient's request will follow up in 1 week.  Will call office if needed prior to next encounter.   Assessment: Review of patient past medical history, allergies, medications, and health status, including review of relevant consultants reports was performed today as part of a comprehensive evaluation and provision of  chronic care management and care coordination services.  SDOH (Social Determinants of Health) assessments and interventions performed:    Advanced Directives Status: Not addressed in this encounter.  Care Plan Conditions to be addressed/monitored: Anxiety and ADHD ;   Care Plan : LCSW Plan of Care  Updates made by Soundra Pilon, LCSW since 01/24/2022 12:00 AM     Problem: Coping Skills      Goal: Coping Skills Enhanced   Start Date: 01/24/2022  This Visit's Progress: On track  Priority: High  Note:   Current Barriers:  Disease Management support and education needs related to ADHD and ADD Lacks knowledge of how to connect   CSW Clinical Goal(s):   patient will work with providers discussed to address needs related to getting back on medication for ADHD  through collaboration with Clinical Child psychotherapist, provider, and care team.   Interventions: Inter-disciplinary care team collaboration (see longitudinal plan of care) Evaluation of current treatment plan related to  self management and patient's adherence to plan as established by provider  Mental Health:  (Status: New goal.) Evaluation of current treatment plan related to ADHD Solution-Focused Strategies employed:  Active listening / Reflection utilized  Problem Solving /Task Center strategies reviewed Discussed treatment options based on need and insurance   Task & activities to accomplish goals: Call to schedule your appointment  [email or web address]    [address] [place], [place] [address]  Tel: [phone]     Sammuel Hines, LCSW Licensed Clinical Social Worker Lavinia Sharps Management  [facility] [phone]

## 2022-01-24 NOTE — Patient Instructions (Signed)
Visit Information  Thank you for taking time to visit with me today. Please don't hesitate to contact me if I can be of assistance to you before our next scheduled telephone appointment.  Following are the goals we discussed today: connecting for medication management   Our next appointment is by telephone on June 21st at 3:45  Please call the care guide team at [phone] if you need to cancel or reschedule your appointment.   If you are experiencing a Mental Health or Behavioral Health Crisis or need someone to talk to, please call the [facility]: [phone]   Following is a copy of your full plan of care:  Care Plan : LCSW Plan of Care  Updates made by Soundra Pilon, LCSW since 01/24/2022 12:00 AM     Problem: Coping Skills      Goal: Coping Skills Enhanced   Start Date: 01/24/2022  This Visit's Progress: On track  Priority: High  Note:   Current Barriers:  Disease Management support and education needs related to ADHD and ADD Lacks knowledge of how to connect   CSW Clinical Goal(s):   patient will work with providers discussed to address needs related to getting back on medication for ADHD  through collaboration with Clinical Child psychotherapist, provider, and care team.   Interventions: Inter-disciplinary care team collaboration (see longitudinal plan of care) Evaluation of current treatment plan related to  self management and patient's adherence to plan as established by provider  Mental Health:  (Status: New goal.) Evaluation of current treatment plan related to ADHD Solution-Focused Strategies employed:  Active listening / Reflection utilized  Problem Solving /Task Center strategies reviewed Discussed treatment options based on need and insurance   Task & activities to accomplish goals: Call to schedule your appointment  [email or web address]    [address] [place], [place] [address]  Tel:  [phone]    Shawn Fowler was given information about Care Management services by the embedded care coordination team including:  Care Management services include personalized support from designated clinical staff supervised by his physician, including individualized plan of care and coordination with other care providers 24/7 contact phone numbers for assistance for urgent and routine care needs. The patient may stop CCM services at any time (effective at the end of the month) by phone call to the office staff.  Patient agreed to services and verbal consent obtained.   Patient verbalizes understanding of instructions and care plan provided today and agrees to view in MyChart. Active MyChart status and patient understanding of how to access instructions and care plan via MyChart confirmed with patient.     Shawn Hines, LCSW Licensed Clinical Social Worker Lavinia Sharps Management  [facility] [phone]

## 2022-01-24 NOTE — Telephone Encounter (Signed)
Patient called [facility] in [place] and they could get patient in until August. Needs sooner appointment, can patient be referred elsewhere. [phone].

## 2022-01-26 ENCOUNTER — Other Ambulatory Visit: Payer: Self-pay

## 2022-01-26 DIAGNOSIS — F902 Attention-deficit hyperactivity disorder, combined type: Secondary | ICD-10-CM

## 2022-01-26 DIAGNOSIS — F411 Generalized anxiety disorder: Secondary | ICD-10-CM

## 2022-01-26 NOTE — Telephone Encounter (Signed)
Referral placed.

## 2022-01-26 NOTE — Telephone Encounter (Signed)
Patient requesting referral to [facility].  Please enter referral to [facility] and I will process

## 2022-01-30 ENCOUNTER — Other Ambulatory Visit: Payer: 59

## 2022-01-31 ENCOUNTER — Ambulatory Visit: Payer: Self-pay | Admitting: Licensed Clinical Social Worker

## 2022-01-31 ENCOUNTER — Telehealth: Payer: 59

## 2022-01-31 DIAGNOSIS — F411 Generalized anxiety disorder: Secondary | ICD-10-CM

## 2022-01-31 DIAGNOSIS — F902 Attention-deficit hyperactivity disorder, combined type: Secondary | ICD-10-CM

## 2022-01-31 NOTE — Patient Instructions (Signed)
  I am sorry you were unable to keep your phone appointment today.    It was a pleasure working with you, and I hope you continue to make great strides in improving your health.  I left you a voice message today, informing you that I will be disconnecting from your care team.  Please call me directly if you have any needs within the next week.  Task & activities to accomplish goals: Call to schedule your appointment  [email or web address]    [address] [place], [place] [address]  Tel: [phone]   You can do a walk-in at [facility]- F walk in clinic 8:00 and 2:00 [facility] [address] [place], [place] [address] [phone]     Follow up Plan:  You do not require continued follow-up by CCM LCSW I will disconnect from your care team at this time Please contact the office if needed  Sammuel Hines, LCSW Licensed Clinical Social Worker Lavinia Sharps Management  [facility] [phone]

## 2022-01-31 NOTE — Chronic Care Management (AMB) (Signed)
Care Management Clinical Social Work Note  01/31/2022 Name: MIRAN KAUTZMAN MRN: [id number] DOB: 31-Jan-1991  Hulan Fess Revolorio is a 31 y.o. year old male who is a primary care patient of Donell Beers, FNP.  The Care Management team was consulted for assistance with chronic disease management and coordination needs.  Assessment: F/U phone call today to assess needs.  Telephone outreach was unsuccessful however review of chart indicated office placed a referral for patient to [facility].  LCSW will also provide information in AVS on MyChart.  Patient has all needed information to connect for mental health support.  Previous encounter patient states he is very busy.  Left voice message for patient.   Follow up Plan:  Patient has all needed information to complete goals  LCSW will disconnect from care team after this encounter.  Patient does not require continued follow-up by CCM LCSW.  Patient will contact the office if needed  Review of patient past medical history, allergies, medications, and health status, including review of relevant consultants reports was performed today as part of a comprehensive evaluation and provision of chronic care management and care coordination services.  SDOH (Social Determinants of Health) assessments and interventions performed:     Care Plan Conditions to be addressed/monitored: ;  ADHD  Care Plan : LCSW Plan of Care  Updates made by Soundra Pilon, LCSW since 01/31/2022 12:00 AM     Problem: Coping Skills      Goal: Coping Skills Enhanced Completed 01/31/2022  Start Date: 01/24/2022  This Visit's Progress: On track  Recent Progress: On track  Priority: High  Note:   Current Barriers:  Disease Management support and education needs related to ADHD and ADD Lacks knowledge of how to connect   CSW Clinical Goal(s):   patient will work with providers discussed to address needs related to getting back on medication for ADHD  through collaboration with  Clinical Child psychotherapist, provider, and care team.   Interventions: Inter-disciplinary care team collaboration (see longitudinal plan of care) Evaluation of current treatment plan related to  self management and patient's adherence to plan as established by provider  Mental Health:  (Status: Goal on Track (progressing): YES.) Evaluation of current treatment plan related to ADHD Solution-Focused Strategies employed:  Active listening / Reflection utilized  Problem Solving /Task Center strategies reviewed  Task & activities to accomplish goals: Call to schedule your appointment  [email or web address]    [address] [place], [place] [address]  Tel: [phone]  You can do a walk-in at [facility]- F walk in clinic 8:00 and 2:00 [facility] [address] [place], [place] [address] [phone]     Sammuel Hines, LCSW Licensed Clinical Social Worker Lavinia Sharps Management  [facility] [phone]

## 2022-02-20 ENCOUNTER — Other Ambulatory Visit: Payer: 59

## 2022-03-01 ENCOUNTER — Encounter: Payer: 59 | Admitting: Nurse Practitioner

## 2022-03-07 ENCOUNTER — Encounter: Payer: 59 | Admitting: Nurse Practitioner

## 2022-04-26 ENCOUNTER — Encounter: Payer: Self-pay | Admitting: Psychiatry

## 2022-04-26 ENCOUNTER — Ambulatory Visit: Payer: Self-pay | Admitting: Psychiatry

## 2022-04-26 NOTE — Progress Notes (Deleted)
   CC:  headaches  Follow-up Visit  Last visit: 11/22/21  Brief HPI: 31 year old male with a history of asthma, ADHD, prior substance abuse who follows in clinic for daily headaches and memory changes. Exam suggestive of occipital neuralgia.  At his last visit, MRI/MRA were ordered. Cymbalta was started for headache prevention and Robaxin was started for neck tension. Interval History: ***  MRI/MRA were never done.  Headache days per month: *** Headache free days per month: *** Headache severity: ***  Current Headache Regimen: Preventative: *** Abortive: ***  # of doses of abortive medications per month: ***  Prior Therapies                                  ***  Physical Exam:   Vital Signs: There were no vitals taken for this visit. GENERAL:  well appearing, in no acute distress, alert  SKIN:  Color, texture, turgor normal. No rashes or lesions HEAD:  Normocephalic/atraumatic. RESP: normal respiratory effort MSK:  No gross joint deformities.   NEUROLOGICAL: Mental Status: Alert, oriented to person, place and time, Follows commands, and Speech fluent and appropriate. Cranial Nerves: PERRL, face symmetric, no dysarthria, hearing grossly intact Motor: moves all extremities equally Gait: normal-based.  IMPRESSION: ***  PLAN: ***   Follow-up: ***  I spent a total of *** minutes on the date of the service. Headache education was done. Discussed lifestyle modification including increased oral hydration, decreased caffeine, exercise and stress management. Discussed treatment options including preventive and acute medications, natural supplements, and infusion therapy. Discussed medication overuse headache and to limit use of acute treatments to no more than 2 days/week or 10 days/month. Discussed medication side effects, adverse reactions and drug interactions. Written educational materials and patient instructions outlining all of the above were given.  Ocie Doyne, MD

## 2022-05-08 DIAGNOSIS — F1520 Other stimulant dependence, uncomplicated: Secondary | ICD-10-CM | POA: Diagnosis not present

## 2022-05-08 DIAGNOSIS — F1120 Opioid dependence, uncomplicated: Secondary | ICD-10-CM | POA: Diagnosis not present

## 2022-05-08 DIAGNOSIS — F1420 Cocaine dependence, uncomplicated: Secondary | ICD-10-CM | POA: Diagnosis not present

## 2022-05-22 ENCOUNTER — Encounter: Payer: Self-pay | Admitting: Internal Medicine

## 2022-05-30 DIAGNOSIS — J4520 Mild intermittent asthma, uncomplicated: Secondary | ICD-10-CM | POA: Diagnosis not present

## 2022-05-30 DIAGNOSIS — Z7689 Persons encountering health services in other specified circumstances: Secondary | ICD-10-CM | POA: Diagnosis not present

## 2022-05-30 DIAGNOSIS — R69 Illness, unspecified: Secondary | ICD-10-CM | POA: Diagnosis not present

## 2022-05-30 DIAGNOSIS — Z131 Encounter for screening for diabetes mellitus: Secondary | ICD-10-CM | POA: Diagnosis not present

## 2022-06-05 DIAGNOSIS — R69 Illness, unspecified: Secondary | ICD-10-CM | POA: Diagnosis not present

## 2022-06-05 DIAGNOSIS — F1520 Other stimulant dependence, uncomplicated: Secondary | ICD-10-CM | POA: Diagnosis not present

## 2022-06-05 DIAGNOSIS — Z79899 Other long term (current) drug therapy: Secondary | ICD-10-CM | POA: Diagnosis not present

## 2022-06-05 DIAGNOSIS — F1420 Cocaine dependence, uncomplicated: Secondary | ICD-10-CM | POA: Diagnosis not present

## 2022-06-07 DIAGNOSIS — Z113 Encounter for screening for infections with a predominantly sexual mode of transmission: Secondary | ICD-10-CM | POA: Diagnosis not present

## 2022-06-07 DIAGNOSIS — J4520 Mild intermittent asthma, uncomplicated: Secondary | ICD-10-CM | POA: Diagnosis not present

## 2022-06-07 DIAGNOSIS — Z131 Encounter for screening for diabetes mellitus: Secondary | ICD-10-CM | POA: Diagnosis not present

## 2022-06-07 DIAGNOSIS — R69 Illness, unspecified: Secondary | ICD-10-CM | POA: Diagnosis not present

## 2022-06-18 DIAGNOSIS — Z79899 Other long term (current) drug therapy: Secondary | ICD-10-CM | POA: Diagnosis not present

## 2022-06-18 DIAGNOSIS — R7303 Prediabetes: Secondary | ICD-10-CM | POA: Diagnosis not present

## 2022-06-18 DIAGNOSIS — I10 Essential (primary) hypertension: Secondary | ICD-10-CM | POA: Diagnosis not present

## 2022-06-18 DIAGNOSIS — G43909 Migraine, unspecified, not intractable, without status migrainosus: Secondary | ICD-10-CM | POA: Diagnosis not present

## 2022-06-18 DIAGNOSIS — J4520 Mild intermittent asthma, uncomplicated: Secondary | ICD-10-CM | POA: Diagnosis not present

## 2022-06-18 DIAGNOSIS — K047 Periapical abscess without sinus: Secondary | ICD-10-CM | POA: Diagnosis not present

## 2022-06-18 DIAGNOSIS — Z0000 Encounter for general adult medical examination without abnormal findings: Secondary | ICD-10-CM | POA: Diagnosis not present

## 2022-06-18 DIAGNOSIS — R69 Illness, unspecified: Secondary | ICD-10-CM | POA: Diagnosis not present

## 2022-07-11 DIAGNOSIS — Z79899 Other long term (current) drug therapy: Secondary | ICD-10-CM | POA: Diagnosis not present

## 2022-07-11 DIAGNOSIS — R69 Illness, unspecified: Secondary | ICD-10-CM | POA: Diagnosis not present

## 2022-07-11 DIAGNOSIS — F1420 Cocaine dependence, uncomplicated: Secondary | ICD-10-CM | POA: Diagnosis not present

## 2022-07-11 DIAGNOSIS — F1520 Other stimulant dependence, uncomplicated: Secondary | ICD-10-CM | POA: Diagnosis not present

## 2022-07-31 DIAGNOSIS — R69 Illness, unspecified: Secondary | ICD-10-CM | POA: Diagnosis not present

## 2022-07-31 DIAGNOSIS — Z880 Allergy status to penicillin: Secondary | ICD-10-CM | POA: Diagnosis not present

## 2022-07-31 DIAGNOSIS — R030 Elevated blood-pressure reading, without diagnosis of hypertension: Secondary | ICD-10-CM | POA: Diagnosis not present

## 2022-07-31 DIAGNOSIS — Z809 Family history of malignant neoplasm, unspecified: Secondary | ICD-10-CM | POA: Diagnosis not present

## 2022-07-31 DIAGNOSIS — Z8249 Family history of ischemic heart disease and other diseases of the circulatory system: Secondary | ICD-10-CM | POA: Diagnosis not present

## 2022-07-31 DIAGNOSIS — Z720 Tobacco use: Secondary | ICD-10-CM | POA: Diagnosis not present

## 2022-07-31 DIAGNOSIS — E669 Obesity, unspecified: Secondary | ICD-10-CM | POA: Diagnosis not present

## 2022-07-31 DIAGNOSIS — Z6835 Body mass index (BMI) 35.0-35.9, adult: Secondary | ICD-10-CM | POA: Diagnosis not present

## 2022-07-31 DIAGNOSIS — K056 Periodontal disease, unspecified: Secondary | ICD-10-CM | POA: Diagnosis not present

## 2022-08-08 DIAGNOSIS — F1520 Other stimulant dependence, uncomplicated: Secondary | ICD-10-CM | POA: Diagnosis not present

## 2022-08-08 DIAGNOSIS — R69 Illness, unspecified: Secondary | ICD-10-CM | POA: Diagnosis not present

## 2022-08-08 DIAGNOSIS — F1420 Cocaine dependence, uncomplicated: Secondary | ICD-10-CM | POA: Diagnosis not present

## 2022-08-08 DIAGNOSIS — Z79899 Other long term (current) drug therapy: Secondary | ICD-10-CM | POA: Diagnosis not present

## 2022-08-24 ENCOUNTER — Ambulatory Visit
Admission: EM | Admit: 2022-08-24 | Discharge: 2022-08-24 | Disposition: A | Discharge status: Home / Self Care | Payer: Medicaid Other

## 2022-08-24 ENCOUNTER — Encounter: Payer: Self-pay | Admitting: Emergency Medicine

## 2022-08-24 DIAGNOSIS — R062 Wheezing: Secondary | ICD-10-CM

## 2022-08-24 DIAGNOSIS — J039 Acute tonsillitis, unspecified: Secondary | ICD-10-CM | POA: Diagnosis not present

## 2022-08-24 DIAGNOSIS — H66002 Acute suppurative otitis media without spontaneous rupture of ear drum, left ear: Secondary | ICD-10-CM

## 2022-08-24 DIAGNOSIS — R051 Acute cough: Secondary | ICD-10-CM

## 2022-08-24 DIAGNOSIS — J4521 Mild intermittent asthma with (acute) exacerbation: Secondary | ICD-10-CM

## 2022-08-24 DIAGNOSIS — J3089 Other allergic rhinitis: Secondary | ICD-10-CM

## 2022-08-24 LAB — POCT RAPID STREP A (OFFICE): Rapid Strep A Screen: NEGATIVE

## 2022-08-24 MED ORDER — DEXAMETHASONE SODIUM PHOSPHATE 10 MG/ML IJ SOLN
10.0000 mg | Freq: Once | INTRAMUSCULAR | Status: AC
  Administered 2022-08-24: 10 mg via INTRAMUSCULAR

## 2022-08-24 MED ORDER — CETIRIZINE HCL 10 MG PO TABS: 10.0000 mg | ORAL_TABLET | Freq: Every day | ORAL | 2 refills | Status: DC

## 2022-08-24 MED ORDER — FLUTICASONE PROPIONATE 50 MCG/ACT NA SUSP: 1.0000 | Freq: Two times a day (BID) | NASAL | 2 refills | Status: DC

## 2022-08-24 MED ORDER — AZITHROMYCIN 250 MG PO TABS: ORAL_TABLET | ORAL | 0 refills | Status: DC

## 2022-08-24 NOTE — ED Triage Notes (Signed)
Fatigue, sore throat, states it hurts to turn head to the sides.  Symptoms x 2 weeks. States he feels dizzy with no energy.  Has been coughing x 2 days.  has been taking day quil and nyquil.  C/o of some nasal congestion

## 2022-08-26 NOTE — ED Provider Notes (Signed)
[facility]    CSN: [id number] Arrival date & time: 08/24/22  1936      History   Chief Complaint No chief complaint on file.   HPI Shawn Fowler is a 32 y.o. male.   Patient presenting today with 2-week history of ongoing severe fatigue, sore throat, swollen tender lymph nodes in neck, congestion, facial pain and pressure, ear pain and pressure, lightheadedness, cough.  Denies known fever, chest pain, shortness of breath, abdominal pain, nausea vomiting or diarrhea.  Taking DayQuil and NyQuil with mild temporary relief but states symptoms are not resolving.  Multiple sick contacts recently.  History of asthma and allergies not currently on a consistent regimen for allergies.  Does take Symbicort, albuterol for his asthma.    Past Medical History:  Diagnosis Date   ADHD    Asthma     Patient Active Problem List   Diagnosis Date Noted   Anxiety 10/14/2021   Hypertension 09/15/2021   Chronic nonintractable headache 09/15/2021   Moderate persistent asthma without complication 06/09/2535   Epidermoid cyst of skin of scalp 09/15/2021   Shortness of breath 09/15/2021   Vapes nicotine containing substance 09/15/2021   Obesity (BMI 35.0-39.9 without comorbidity) 09/15/2021   Mild cannabis use disorder 04/30/2019   Generalized anxiety disorder 04/30/2019   Attention deficit hyperactivity disorder, combined type, By History  04/30/2019   Posttraumatic stress disorder 04/30/2019    Past Surgical History:  Procedure Laterality Date   MOUTH SURGERY     TONSILLECTOMY     WRIST SURGERY Left        Home Medications    Prior to Admission medications   Medication Sig Start Date End Date Taking? Authorizing Provider  amphetamine-dextroamphetamine (ADDERALL XR) 5 MG 24 hr capsule Take 5 mg by mouth daily.   Yes [provider]  azithromycin (ZITHROMAX) 250 MG tablet Take first 2 tablets together, then 1 every day until finished. 08/24/22  Yes Volney American, PA-C  cetirizine (ZYRTEC ALLERGY) 10 MG tablet Take 1 tablet (10 mg total) by mouth daily. 08/24/22  Yes Volney American, PA-C  fluticasone [facility]) 50 MCG/ACT nasal spray Place 1 spray into both nostrils 2 (two) times daily. 08/24/22  Yes Volney American, PA-C  albuterol (VENTOLIN HFA) 108 (90 Base) MCG/ACT inhaler Inhale 2 puffs into the lungs every 6 (six) hours as needed for wheezing or shortness of breath. 09/15/21   Paseda, Dewaine Conger, FNP  amLODipine (NORVASC) 2.5 MG tablet Take 1 tablet (2.5 mg total) by mouth daily. 09/15/21   Renee Rival, FNP  budesonide-formoterol (SYMBICORT) 80-4.5 MCG/ACT inhaler Inhale 2 puffs into the lungs 2 (two) times daily. 09/15/21   Renee Rival, FNP  cetirizine (ZYRTEC ALLERGY) 10 MG tablet Take 1 tablet (10 mg total) by mouth daily. 08/16/21   Volney American, PA-C  DULoxetine (CYMBALTA) 20 MG capsule Take 1 capsule (20 mg total) by mouth daily. 11/22/21   Genia Harold, MD  fluticasone (FLONASE) 50 MCG/ACT nasal spray Place 1 spray into both nostrils 2 (two) times daily. 12/05/21   Renee Rival, FNP  ibuprofen (ADVIL) 400 MG tablet Take 1 tablet (400 mg total) by mouth daily as needed. 10/13/21   Renee Rival, FNP  LORazepam (ATIVAN) 0.5 MG tablet Take 1-2 pills 30 minutes prior to MRI 01/05/22   Genia Harold, MD  methocarbamol (ROBAXIN) 500 MG tablet Take 1 tablet (500 mg total) by mouth every 8 (eight) hours as needed for muscle  spasms (headaches). 11/22/21   Ocie Doyne, MD    Family History Family History  Problem Relation Age of Onset   Bipolar disorder Mother    Heart Problems Mother    Heart Problems Father    Kidney disease Maternal Uncle    Heart Problems Maternal Grandmother    Lung cancer Maternal Grandmother    Heart Problems Maternal Grandfather    Heart Problems Paternal Grandmother    Heart Problems Paternal Grandfather        Passed from brain tumor   Cancer - Prostate Neg Hx     Colon cancer Neg Hx     Social History Social History   Tobacco Use   Smoking status: Some Days    Packs/day: 1.00    Years: 10.00    Total pack years: 10.00    Types: E-cigarettes, Cigarettes    Last attempt to quit: 09/16/2019    Years since quitting: 2.9   Smokeless tobacco: Never   Tobacco comments:    Started smoking at age 65, smoked one pack a day for 10 years. He picked up vaping after he quit cigarettes.   Vaping Use   Vaping Use: Some days   Devices: one cannister a week.  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     Allergies   Penicillins and Aspirin   Review of Systems Review of Systems Per HPI  Physical Exam Triage Vital Signs ED Triage Vitals  Enc Vitals Group     BP 08/24/22 1945 125/84     Pulse Rate 08/24/22 1945 (!) 138     Resp 08/24/22 1945 16     Temp 08/24/22 1945 99.1 F (37.3 C)     Temp Source 08/24/22 1945 Oral     SpO2 08/24/22 1945 94 %     Weight --      Height --      Head Circumference --      Peak Flow --      Pain Score 08/24/22 1948 7     Pain Loc --      Pain Edu? --      Excl. in GC? --    No data found.  Updated Vital Signs BP 125/84 (BP Location: Right Arm)   Pulse (!) 138   Temp 99.1 F (37.3 C) (Oral)   Resp 16   SpO2 94%   Visual Acuity Right Eye Distance:   Left Eye Distance:   Bilateral Distance:    Right Eye Near:   Left Eye Near:    Bilateral Near:     Physical Exam Vitals and nursing note reviewed.  Constitutional:      Appearance: He is well-developed.  HENT:     Head: Atraumatic.     Right Ear: External ear normal.     Left Ear: External ear normal.     Ears:     Comments: Left TM erythematous and edematous    Nose: Congestion present.     Mouth/Throat:     Mouth: Mucous membranes are moist.      Pharynx: Posterior oropharyngeal erythema present. No oropharyngeal exudate.  Eyes:     Conjunctiva/sclera: Conjunctivae normal.     Pupils: Pupils are equal, round, and reactive to light.  Cardiovascular:  Rate and Rhythm: Normal rate and regular rhythm.  Pulmonary:     Effort: Pulmonary effort is normal. No respiratory distress.     Breath sounds: Wheezing present. No rales.  Musculoskeletal:        General: Normal range of motion.     Cervical back: Normal range of motion and neck supple.  Lymphadenopathy:     Cervical: Cervical adenopathy present.  Skin:    General: Skin is warm and dry.  Neurological:     Mental Status: He is alert and oriented to person, place, and time.     Motor: No weakness.     Gait: Gait normal.  Psychiatric:        Mood and Affect: Mood normal.        Behavior: Behavior normal.        Thought Content: Thought content normal.        Judgment: Judgment normal.      UC Treatments / Results  Labs (all labs ordered are listed, but only abnormal results are displayed) Labs Reviewed  POCT RAPID STREP A (OFFICE)    EKG   Radiology No results found.  Procedures Procedures (including critical care time)  Medications Ordered in UC Medications  dexamethasone (DECADRON) injection 10 mg (10 mg Intramuscular Given 08/24/22 2023)    Initial Impression / Assessment and Plan / UC Course  I have reviewed the triage vital signs and the nursing notes.  Pertinent labs & imaging results that were available during my care of the patient were reviewed by me and considered in my medical decision making (see chart for details).     Patient with apparently multiple issues causing his symptoms at this time.  Suspect initially viral versus allergic cause and now has left ear infection, asthma exacerbation, and tonsillitis.  Rapid strep was negative, declines viral testing today which is reasonable given duration of symptoms.  Will treat with Zithromax, IM  Decadron, Flonase, Zyrtec and monitor for benefit.  Discussed importance of consistency with allergy regimen.  Return for worsening symptoms.  Final Clinical Impressions(s) / UC Diagnoses   Final diagnoses:  Non-recurrent acute suppurative otitis media of left ear without spontaneous rupture of tympanic membrane  Acute tonsillitis, unspecified etiology  Seasonal allergic rhinitis due to other allergic trigger  Acute cough  Wheezing  Mild intermittent asthma with acute exacerbation   Discharge Instructions   None    ED Prescriptions     Medication Sig Dispense Auth. Provider   azithromycin (ZITHROMAX) 250 MG tablet Take first 2 tablets together, then 1 every day until finished. 6 tablet Volney American, PA-C   fluticasone [facility] Brasil [facility]) 50 MCG/ACT nasal spray Place 1 spray into both nostrils 2 (two) times daily. 16 g Volney American, [place]   cetirizine (ZYRTEC ALLERGY) 10 MG tablet Take 1 tablet (10 mg total) by mouth daily. 30 tablet Volney American, [place]      PDMP not reviewed this encounter.   Merrie Roof Hardy, [place] 08/26/22 [phone]

## 2022-09-10 DIAGNOSIS — F1120 Opioid dependence, uncomplicated: Secondary | ICD-10-CM | POA: Diagnosis not present

## 2022-09-10 DIAGNOSIS — F1420 Cocaine dependence, uncomplicated: Secondary | ICD-10-CM | POA: Diagnosis not present

## 2022-09-10 DIAGNOSIS — Z79899 Other long term (current) drug therapy: Secondary | ICD-10-CM | POA: Diagnosis not present

## 2022-09-10 DIAGNOSIS — F1520 Other stimulant dependence, uncomplicated: Secondary | ICD-10-CM | POA: Diagnosis not present

## 2022-09-10 DIAGNOSIS — R69 Illness, unspecified: Secondary | ICD-10-CM | POA: Diagnosis not present

## 2023-03-27 ENCOUNTER — Observation Stay (HOSPITAL_COMMUNITY)
Admission: EM | Admit: 2023-03-27 | Discharge: 2023-03-28 | Disposition: A | Discharge status: Home / Self Care | Payer: Medicaid Other | Attending: Internal Medicine | Admitting: Internal Medicine

## 2023-03-27 ENCOUNTER — Emergency Department (HOSPITAL_COMMUNITY): Payer: Medicaid Other

## 2023-03-27 ENCOUNTER — Encounter (HOSPITAL_COMMUNITY): Payer: Self-pay

## 2023-03-27 ENCOUNTER — Other Ambulatory Visit: Payer: Self-pay

## 2023-03-27 DIAGNOSIS — F902 Attention-deficit hyperactivity disorder, combined type: Secondary | ICD-10-CM | POA: Diagnosis present

## 2023-03-27 DIAGNOSIS — L03116 Cellulitis of left lower limb: Principal | ICD-10-CM | POA: Insufficient documentation

## 2023-03-27 DIAGNOSIS — I10 Essential (primary) hypertension: Secondary | ICD-10-CM | POA: Insufficient documentation

## 2023-03-27 DIAGNOSIS — E669 Obesity, unspecified: Secondary | ICD-10-CM | POA: Diagnosis present

## 2023-03-27 DIAGNOSIS — F1210 Cannabis abuse, uncomplicated: Secondary | ICD-10-CM | POA: Diagnosis present

## 2023-03-27 DIAGNOSIS — M25572 Pain in left ankle and joints of left foot: Secondary | ICD-10-CM | POA: Diagnosis present

## 2023-03-27 DIAGNOSIS — Z79899 Other long term (current) drug therapy: Secondary | ICD-10-CM | POA: Insufficient documentation

## 2023-03-27 DIAGNOSIS — L0390 Cellulitis, unspecified: Secondary | ICD-10-CM | POA: Diagnosis present

## 2023-03-27 DIAGNOSIS — F17210 Nicotine dependence, cigarettes, uncomplicated: Secondary | ICD-10-CM | POA: Insufficient documentation

## 2023-03-27 DIAGNOSIS — J45909 Unspecified asthma, uncomplicated: Secondary | ICD-10-CM | POA: Diagnosis not present

## 2023-03-27 LAB — COMPREHENSIVE METABOLIC PANEL
ALT: 22 U/L (ref 0–44)
AST: 21 U/L (ref 15–41)
Albumin: 4 g/dL (ref 3.5–5.0)
Alkaline Phosphatase: 78 U/L (ref 38–126)
Anion gap: 8 (ref 5–15)
BUN: 12 mg/dL (ref 6–20)
CO2: 26 mmol/L (ref 22–32)
Calcium: 8.7 mg/dL — ABNORMAL LOW (ref 8.9–10.3)
Chloride: 102 mmol/L (ref 98–111)
Creatinine, Ser: 0.75 mg/dL (ref 0.61–1.24)
GFR, Estimated: >60 mL/min (ref >60)
Glucose, Bld: 108 mg/dL — ABNORMAL HIGH (ref 70–99)
Potassium: 3.5 mmol/L (ref 3.5–5.1)
Sodium: 136 mmol/L (ref 135–145)
Total Bilirubin: 0.4 mg/dL (ref 0.3–1.2)
Total Protein: 6.4 g/dL — ABNORMAL LOW (ref 6.5–8.1)

## 2023-03-27 LAB — CBC WITH DIFFERENTIAL/PLATELET
Abs Immature Granulocytes: 0.06 10*3/uL (ref 0.00–0.07)
Basophils Absolute: 0.1 10*3/uL (ref 0.0–0.1)
Basophils Relative: 1 %
Eosinophils Absolute: 0.3 10*3/uL (ref 0.0–0.5)
Eosinophils Relative: 2 %
HCT: 37.1 % — ABNORMAL LOW (ref 39.0–52.0)
Hemoglobin: 12.5 g/dL — ABNORMAL LOW (ref 13.0–17.0)
Immature Granulocytes: 0 %
Lymphocytes Relative: 21 %
Lymphs Abs: 3.1 10*3/uL (ref 0.7–4.0)
MCH: 29.2 pg (ref 26.0–34.0)
MCHC: 33.7 g/dL (ref 30.0–36.0)
MCV: 86.7 fL (ref 80.0–100.0)
Monocytes Absolute: 1.1 10*3/uL — ABNORMAL HIGH (ref 0.1–1.0)
Monocytes Relative: 7 %
Neutro Abs: 10.2 10*3/uL — ABNORMAL HIGH (ref 1.7–7.7)
Neutrophils Relative %: 69 %
Platelets: 229 10*3/uL (ref 150–400)
RBC: 4.28 MIL/uL (ref 4.22–5.81)
RDW: 13.3 % (ref 11.5–15.5)
WBC: 14.8 10*3/uL — ABNORMAL HIGH (ref 4.0–10.5)
nRBC: 0 % (ref 0.0–0.2)

## 2023-03-27 LAB — SEDIMENTATION RATE: Sed Rate: 5 mm/hr (ref 0–16)

## 2023-03-27 LAB — URIC ACID: Uric Acid, Serum: 5.2 mg/dL (ref 3.7–8.6)

## 2023-03-27 MED ORDER — ONDANSETRON HCL 4 MG PO TABS: 4.0000 mg | ORAL_TABLET | Freq: Four times a day (QID) | ORAL | Status: DC | PRN

## 2023-03-27 MED ORDER — ACETAMINOPHEN 325 MG PO TABS: 650.0000 mg | ORAL_TABLET | Freq: Four times a day (QID) | ORAL | Status: DC | PRN

## 2023-03-27 MED ORDER — FLUTICASONE PROPIONATE 50 MCG/ACT NA SUSP: 1.0000 | Freq: Two times a day (BID) | NASAL | Status: DC | PRN

## 2023-03-27 MED ORDER — NICOTINE 14 MG/24HR TD PT24
14.0000 mg | MEDICATED_PATCH | Freq: Every day | TRANSDERMAL | Status: DC
  Administered 2023-03-27 – 2023-03-28 (×2): 14 mg via TRANSDERMAL
  Filled 2023-03-27 (×2): qty 1

## 2023-03-27 MED ORDER — ENOXAPARIN SODIUM 40 MG/0.4ML IJ SOSY: 40.0000 mg | PREFILLED_SYRINGE | INTRAMUSCULAR | Status: DC

## 2023-03-27 MED ORDER — ONDANSETRON HCL 4 MG/2ML IJ SOLN: 4.0000 mg | Freq: Four times a day (QID) | INTRAMUSCULAR | Status: DC | PRN

## 2023-03-27 MED ORDER — LORATADINE 10 MG PO TABS: 10.0000 mg | ORAL_TABLET | Freq: Every day | ORAL | Status: DC | PRN

## 2023-03-27 MED ORDER — ACETAMINOPHEN 650 MG RE SUPP: 650.0000 mg | Freq: Four times a day (QID) | RECTAL | Status: DC | PRN

## 2023-03-27 MED ORDER — CEFAZOLIN SODIUM-DEXTROSE 2-4 GM/100ML-% IV SOLN
2.0000 g | Freq: Once | INTRAVENOUS | Status: AC
  Administered 2023-03-27: 2 g via INTRAVENOUS
  Filled 2023-03-27: qty 100

## 2023-03-27 MED ORDER — SODIUM CHLORIDE 0.9 % IV BOLUS
1000.0000 mL | Freq: Once | INTRAVENOUS | Status: AC
  Administered 2023-03-27: 1000 mL via INTRAVENOUS

## 2023-03-27 MED ORDER — ENOXAPARIN SODIUM 60 MG/0.6ML IJ SOSY
60.0000 mg | PREFILLED_SYRINGE | INTRAMUSCULAR | Status: DC
  Administered 2023-03-27: 60 mg via SUBCUTANEOUS
  Filled 2023-03-27: qty 0.6

## 2023-03-27 MED ORDER — FLUTICASONE PROPIONATE 50 MCG/ACT NA SUSP: 1.0000 | Freq: Two times a day (BID) | NASAL | Status: DC

## 2023-03-27 MED ORDER — LORATADINE 10 MG PO TABS: 10.0000 mg | ORAL_TABLET | Freq: Every day | ORAL | Status: DC

## 2023-03-27 MED ORDER — CEFAZOLIN SODIUM-DEXTROSE 1-4 GM/50ML-% IV SOLN
1.0000 g | Freq: Once | INTRAVENOUS | Status: DC
Start: 2023-03-27 — End: 2023-03-27

## 2023-03-27 MED ORDER — MOMETASONE FURO-FORMOTEROL FUM 100-5 MCG/ACT IN AERO: 2.0000 | INHALATION_SPRAY | Freq: Two times a day (BID) | RESPIRATORY_TRACT | Status: DC

## 2023-03-27 MED ORDER — OXYCODONE HCL 5 MG PO TABS
5.0000 mg | ORAL_TABLET | Freq: Four times a day (QID) | ORAL | Status: DC | PRN
  Administered 2023-03-27: 5 mg via ORAL
  Filled 2023-03-27: qty 1

## 2023-03-27 MED ORDER — AMPHETAMINE-DEXTROAMPHET ER 10 MG PO CP24
20.0000 mg | ORAL_CAPSULE | Freq: Two times a day (BID) | ORAL | Status: DC
  Administered 2023-03-28: 20 mg via ORAL
  Filled 2023-03-27: qty 2

## 2023-03-27 MED ORDER — DULOXETINE HCL 20 MG PO CPEP: 20.0000 mg | ORAL_CAPSULE | Freq: Every day | ORAL | Status: DC

## 2023-03-27 MED ORDER — LIDOCAINE-EPINEPHRINE (PF) 2 %-1:200000 IJ SOLN
20.0000 mL | Freq: Once | INTRAMUSCULAR | Status: AC
  Administered 2023-03-27: 20 mL
  Filled 2023-03-27: qty 20

## 2023-03-27 MED ORDER — ONDANSETRON HCL 4 MG/2ML IJ SOLN
4.0000 mg | Freq: Once | INTRAMUSCULAR | Status: AC
  Administered 2023-03-27: 4 mg via INTRAVENOUS
  Filled 2023-03-27: qty 2

## 2023-03-27 MED ORDER — MORPHINE SULFATE (PF) 4 MG/ML IV SOLN
4.0000 mg | Freq: Once | INTRAVENOUS | Status: AC
  Administered 2023-03-27: 4 mg via INTRAVENOUS
  Filled 2023-03-27: qty 1

## 2023-03-27 MED ORDER — AMPHETAMINE-DEXTROAMPHET ER 5 MG PO CP24: 5.0000 mg | ORAL_CAPSULE | Freq: Every day | ORAL | Status: DC

## 2023-03-27 MED ORDER — CEFAZOLIN SODIUM-DEXTROSE 2-4 GM/100ML-% IV SOLN
2.0000 g | Freq: Three times a day (TID) | INTRAVENOUS | Status: DC
  Administered 2023-03-27 – 2023-03-28 (×2): 2 g via INTRAVENOUS
  Filled 2023-03-27 (×2): qty 100

## 2023-03-27 MED ORDER — ALBUTEROL SULFATE (2.5 MG/3ML) 0.083% IN NEBU: 3.0000 mL | INHALATION_SOLUTION | Freq: Four times a day (QID) | RESPIRATORY_TRACT | Status: DC | PRN

## 2023-03-27 MED ORDER — BUPRENORPHINE HCL 8 MG SL SUBL
8.0000 mg | SUBLINGUAL_TABLET | Freq: Three times a day (TID) | SUBLINGUAL | Status: DC
  Administered 2023-03-27 – 2023-03-28 (×2): 8 mg via SUBLINGUAL
  Filled 2023-03-27 (×2): qty 1

## 2023-03-27 MED ORDER — ORAL CARE MOUTH RINSE: 15.0000 mL | OROMUCOSAL | Status: DC | PRN

## 2023-03-27 NOTE — ED Provider Notes (Signed)
[facility] AT [facility] Provider Note  CSN: [id number] Arrival date & time: 03/27/23 7829  Chief Complaint(s) Ankle Pain  HPI Shawn Fowler is a 32 y.o. male with past medical history as below, significant for ADHD, asthma, THC use who presents to the ED with complaint of ankle pain left.  Onset of left ankle pain overnight, denies any falls or traumatic injuries, no wounds to the ankle.  Feels the pain began spontaneously.  He initially was able to ambulate and now he is having to hobble, difficulty putting weight on his left ankle.  No fevers or chills, no nausea or vomiting.  Left ankle is swollen and warm but no overt wounds.  He is not diabetic, no recent falls or injuries or traumatic injury to the ankle.  No pain to ipsilateral knee or hip.  No medication prior to arrival  Past Medical History Past Medical History:  Diagnosis Date   ADHD    Asthma    Patient Active Problem List   Diagnosis Date Noted   Cellulitis 03/27/2023   Anxiety 10/14/2021   Hypertension 09/15/2021   Chronic nonintractable headache 09/15/2021   Moderate persistent asthma without complication 09/15/2021   Epidermoid cyst of skin of scalp 09/15/2021   Shortness of breath 09/15/2021   Vapes nicotine containing substance 09/15/2021   Obesity (BMI 35.0-39.9 without comorbidity) 09/15/2021   Mild cannabis use disorder 04/30/2019   Generalized anxiety disorder 04/30/2019   Attention deficit hyperactivity disorder, combined type, By History  04/30/2019   Posttraumatic stress disorder 04/30/2019   Home Medication(s) Prior to Admission medications   Medication Sig Start Date End Date Taking? Authorizing Provider  albuterol (VENTOLIN HFA) 108 (90 Base) MCG/ACT inhaler Inhale 2 puffs into the lungs every 6 (six) hours as needed for wheezing or shortness of breath. 09/15/21   Paseda, Baird Kay, FNP  amLODipine (NORVASC) 2.5 MG tablet Take 1 tablet (2.5 mg total) by mouth daily.  09/15/21   Donell Beers, FNP  amphetamine-dextroamphetamine (ADDERALL XR) 5 MG 24 hr capsule Take 5 mg by mouth daily.    [provider]  azithromycin (ZITHROMAX) 250 MG tablet Take first 2 tablets together, then 1 every day until finished. 08/24/22   Particia Nearing, PA-C  budesonide-formoterol [facility]) 80-4.5 MCG/ACT inhaler Inhale 2 puffs into the lungs 2 (two) times daily. 09/15/21   Donell Beers, FNP  cetirizine (ZYRTEC ALLERGY) 10 MG tablet Take 1 tablet (10 mg total) by mouth daily. 08/16/21   Particia Nearing, PA-C  cetirizine (ZYRTEC ALLERGY) 10 MG tablet Take 1 tablet (10 mg total) by mouth daily. 08/24/22   Particia Nearing, PA-C  DULoxetine (CYMBALTA) 20 MG capsule Take 1 capsule (20 mg total) by mouth daily. 11/22/21   Ocie Doyne, MD  fluticasone (FLONASE) 50 MCG/ACT nasal spray Place 1 spray into both nostrils 2 (two) times daily. 12/05/21   Paseda, Baird Kay, FNP  fluticasone (FLONASE) 50 MCG/ACT nasal spray Place 1 spray into both nostrils 2 (two) times daily. 08/24/22   Particia Nearing, PA-C  ibuprofen (ADVIL) 400 MG tablet Take 1 tablet (400 mg total) by mouth daily as needed. 10/13/21   Donell Beers, FNP  LORazepam (ATIVAN) 0.5 MG tablet Take 1-2 pills 30 minutes prior to MRI 01/05/22   Ocie Doyne, MD  methocarbamol (ROBAXIN) 500 MG tablet Take 1 tablet (500 mg total) by mouth every 8 (eight) hours as needed for muscle spasms (headaches). 11/22/21   Ocie Doyne,  MD                                                                                                                                    Past Surgical History Past Surgical History:  Procedure Laterality Date   MOUTH SURGERY     TONSILLECTOMY     WRIST SURGERY Left    Family History Family History  Problem Relation Age of Onset   Bipolar disorder Mother    Heart Problems Mother    Heart Problems Father    Kidney disease Maternal Uncle    Heart Problems  Maternal Grandmother    Lung cancer Maternal Grandmother    Heart Problems Maternal Grandfather    Heart Problems Paternal Grandmother    Heart Problems Paternal Grandfather        Passed from brain tumor   Cancer - Prostate Neg Hx    Colon cancer Neg Hx     Social History Social History   Tobacco Use   Smoking status: Some Days    Current packs/day: 0.00    Average packs/day: 1 pack/day for 10.0 years (10.0 ttl pk-yrs)    Types: E-cigarettes, Cigarettes    Start date: 09/15/2009    Last attempt to quit: 09/16/2019    Years since quitting: 3.5   Smokeless tobacco: Never   Tobacco comments:    Started smoking at age 24, smoked one pack a day for 10 years. He picked up vaping after he quit cigarettes.   Vaping Use   Vaping status: Some Days   Devices: one cannister a week.  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   Allergies Penicillins and Aspirin  Review of Systems Review of Systems  Constitutional:  Negative for chills and fever.  HENT:  Negative for facial swelling and trouble swallowing.   Eyes:  Negative for photophobia and visual disturbance.  Respiratory:  Negative for cough and shortness of breath.   Cardiovascular:  Negative for chest pain and palpitations.  Gastrointestinal:  Negative for abdominal pain, nausea and vomiting.  Endocrine: Negative for polydipsia and polyuria.  Genitourinary:  Negative for difficulty urinating and hematuria.  Musculoskeletal:  Positive for arthralgias and gait problem. Negative for joint swelling.  Skin:  Positive for rash. Negative for pallor.  Neurological:  Negative for syncope and headaches.  Psychiatric/Behavioral:  Negative for agitation and confusion.     Physical Exam Vital Signs  I have reviewed the triage vital signs BP 123/76    Pulse 89   Temp 97.8 F (36.6 C) (Oral)   Resp 18   Ht 6\' 2"  (1.88 m)   Wt 128.4 kg   SpO2 95%   BMI 36.34 kg/m  Physical Exam  Vitals and nursing note reviewed.  Constitutional:      General: He is not in acute distress.    Appearance: Normal appearance. He is well-developed. He is not ill-appearing.  HENT:     Head: Normocephalic and atraumatic.     Right Ear: External ear normal.     Left Ear: External ear normal.     Nose: Nose normal.     Mouth/Throat:     Mouth: Mucous membranes are moist.  Eyes:     General: No scleral icterus.       Right eye: No discharge.        Left eye: No discharge.  Cardiovascular:     Rate and Rhythm: Tachycardia present.     Pulses: Normal pulses.  Pulmonary:     Effort: Pulmonary effort is normal. No respiratory distress.     Breath sounds: No stridor.  Abdominal:     General: Abdomen is flat. There is no distension.     Palpations: Abdomen is soft.     Tenderness: There is no abdominal tenderness. There is no guarding.  Musculoskeletal:        General: No deformity.     Cervical back: No rigidity.       Feet:  Feet:     Comments: Erythema and swelling noted to left ankle, diffusely tender to palpation, no crepitance, DP pulses intact.  Reduced range of motion secondary to pain.  No pain to ipsilateral knee or hip  No obvious wounds or laceration Skin:    General: Skin is warm and dry.     Coloration: Skin is not cyanotic, jaundiced or pale.  Neurological:     Mental Status: He is alert and oriented to person, place, and time.     GCS: GCS eye subscore is 4. GCS verbal subscore is 5. GCS motor subscore is 6.  Psychiatric:        Speech: Speech normal.        Behavior: Behavior normal. Behavior is cooperative.            ED Results and Treatments Labs (all labs ordered are listed, but only abnormal results are displayed) Labs Reviewed  CBC WITH DIFFERENTIAL/PLATELET - Abnormal; Notable for the following components:       Result Value   WBC 14.8 (*)    Hemoglobin 12.5 (*)    HCT 37.1 (*)    Neutro Abs 10.2 (*)    Monocytes Absolute 1.1 (*)    All other components within normal limits  COMPREHENSIVE METABOLIC PANEL - Abnormal; Notable for the following components:   Glucose, Bld 108 (*)    Calcium 8.7 (*)    Total Protein 6.4 (*)    All other components within normal limits  CULTURE, BLOOD (ROUTINE X 2)  CULTURE, BLOOD (ROUTINE X 2)  BODY FLUID CULTURE W GRAM STAIN  SEDIMENTATION RATE  URIC ACID  C-REACTIVE PROTEIN  Radiology DG Foot Complete Left  Result Date: 03/27/2023 CLINICAL DATA:  [id number] Ankle pain, left [phone] and swelling EXAM: LEFT FOOT - COMPLETE 3+ VIEW COMPARISON:  10/18/2008 FINDINGS: There is no evidence of fracture or dislocation. Small calcaneal spur. There is no evidence of arthropathy or other focal bone abnormality. Soft tissues are unremarkable. IMPRESSION: Negative. Electronically Signed   By: Corlis Leak M.D.   On: 03/27/2023 07:53    Pertinent labs & imaging results that were available during my care of the patient were reviewed by me and considered in my medical decision making (see MDM for details).  Medications Ordered in ED Medications  ceFAZolin (ANCEF) IVPB 2g/100 mL premix (has no administration in time range)  sodium chloride 0.9 % bolus 1,000 mL (1,000 mLs Intravenous Bolus 03/27/23 0825)  morphine (PF) 4 MG/ML injection 4 mg (4 mg Intravenous Given 03/27/23 0825)  ondansetron (ZOFRAN) injection 4 mg (4 mg Intravenous Given 03/27/23 0825)  lidocaine-EPINEPHrine (XYLOCAINE W/EPI) 2 %-1:200000 (PF) injection 20 mL (20 mLs Other Given by Other 03/27/23 1144)                                                                                                                                     Procedures .Joint Aspiration/Arthrocentesis  Date/Time:  03/27/2023 12:15 PM  Performed by: Sloan Leiter, DO Authorized by: Sloan Leiter, DO   Consent:    Consent obtained:  Verbal   Consent given by:  Patient   Risks, benefits, and alternatives were discussed: yes     Risks discussed:  Bleeding, infection and pain   Alternatives discussed:  Delayed treatment and alternative treatment Universal protocol:    Procedure explained and questions answered to patient or proxy's satisfaction: yes     Immediately prior to procedure, a time out was called: yes     Patient identity confirmed:  Verbally with patient and arm band Location:    Location:  Ankle   Ankle:  L ankle Anesthesia:    Anesthesia method:  Local infiltration   Local anesthetic:  Lidocaine 2% WITH epi Procedure details:    Preparation: Patient was prepped and draped in usual sterile fashion     Needle gauge:  22 G   Ultrasound guidance: no     Approach:  Lateral   Aspirate amount:  0.5 ml   Aspirate characteristics:  Clear   Steroid injected: no     Specimen collected: yes   Post-procedure details:    Dressing:  Adhesive bandage   Procedure completion:  Tolerated well, no immediate complications   (including critical care time)  Medical Decision Making / ED Course    Medical Decision Making:    KEELIN ALBRACHT is a 32 y.o. male with past medical history as below, significant for ADHD, asthma, THC use who presents to the ED with complaint of ankle pain right. . The complaint involves an extensive differential diagnosis  and also carries with it a high risk of complications and morbidity.  Serious etiology was considered. Ddx includes but is not limited to: Septic arthritis, gout, MSK, cellulitis, osteomyelitis, etc.  Complete initial physical exam performed, notably the patient  was tachycardic, afebrile, pain to left ankle noted.    Reviewed and confirmed nursing documentation for past medical history, family history, social history.  Vital signs reviewed.     Clinical Course as of 03/27/23 1357  Wed Mar 27, 2023  1107 Pt with erythema overlying typical location of antiomedial aspiration, will trial anteriolateral  [SG]  1213 Arthrocentesis completed  [SG]  1219 Ortho paged [SG]  1328 No callback from ortho, repage [SG]  1332 Spoke with ortho, recommend ancef and admit for IV abx. Hospitalist admission  [SG]    Clinical Course User Index [SG] Sloan Leiter, DO    Narrative: 32 year old male here with left ankle pain Started spontaneously in the night and has gradually worsened No fevers, no wounds Reports similar episodes 1-2 wk ago which resolved spontaneously WBC 14.8, not septic, unable to really ROM his left ankle. No fever, he does cellulitis overlying ankle  Was able to aspirate anterior/lateral aspect of ankle where there was no overlying cellulitis. Will d/w ortho Dr Romeo Apple recommends admission/IV abx; does not believe requires washout urgently Concern for cellulitis of ankle, also concern for possible joint infection. Continue IV abx Ancef recommended by Dr Romeo Apple Admitted Dr Sherryll Burger [facility]                 Additional history obtained: -Additional history obtained from na -External records from outside source obtained and reviewed including: Chart review including previous notes, labs, imaging, consultation notes including prior ED visit, prior labs/imaging    Lab Tests: -I ordered, reviewed, and interpreted labs.   The pertinent results include:   Labs Reviewed  CBC WITH DIFFERENTIAL/PLATELET - Abnormal; Notable for the following components:      Result Value   WBC 14.8 (*)    Hemoglobin 12.5 (*)    HCT 37.1 (*)    Neutro Abs 10.2 (*)    Monocytes Absolute 1.1 (*)    All other components within normal limits  COMPREHENSIVE METABOLIC PANEL - Abnormal; Notable for the following components:   Glucose, Bld 108 (*)    Calcium 8.7 (*)    Total Protein 6.4 (*)    All other components within normal limits   CULTURE, BLOOD (ROUTINE X 2)  CULTURE, BLOOD (ROUTINE X 2)  BODY FLUID CULTURE W GRAM STAIN  SEDIMENTATION RATE  URIC ACID  C-REACTIVE PROTEIN    Notable for leukocytosis  EKG   EKG Interpretation Date/Time:    Ventricular Rate:    PR Interval:    QRS Duration:    QT Interval:    QTC Calculation:   R Axis:      Text Interpretation:           Imaging Studies ordered: I ordered imaging studies including foot xr I independently visualized the following imaging with scope of interpretation limited to determining acute life threatening conditions related to emergency care; findings noted above, significant for stable xr I independently visualized and interpreted imaging. I agree with the radiologist interpretation   Medicines ordered and prescription drug management: Meds ordered this encounter  Medications   sodium chloride 0.9 % bolus 1,000 mL   morphine (PF) 4 MG/ML injection 4 mg   ondansetron (ZOFRAN) injection 4 mg   lidocaine-EPINEPHrine (XYLOCAINE W/EPI) 2 %-1:200000 (  PF) injection 20 mL   DISCONTD: ceFAZolin (ANCEF) IVPB 1 g/50 mL premix   ceFAZolin (ANCEF) IVPB 2g/100 mL premix    -I have reviewed the patients home medicines and have made adjustments as needed   Consultations Obtained: I requested consultation with the Dr Romeo Apple,  and discussed lab and imaging findings as well as pertinent plan - they recommend: admit   Cardiac Monitoring: Continuous pulse oximetry interpreted by myself, 99% on RA.    Social Determinants of Health:  Diagnosis or treatment significantly limited by social determinants of health: current smoker   Reevaluation: After the interventions noted above, I reevaluated the patient and found that they have improved  Co morbidities that complicate the patient evaluation  Past Medical History:  Diagnosis Date   ADHD    Asthma       Dispostion: Disposition decision including need for hospitalization was considered, and  patient admitted to the hospital.    Final Clinical Impression(s) / ED Diagnoses Final diagnoses:  Cellulitis of left lower extremity  Acute left ankle pain        Sloan Leiter, DO 03/27/23 1358

## 2023-03-27 NOTE — ED Triage Notes (Signed)
Pt comes in with lt ankle swelling and pain starting at 0300 this morning. Pt states this has happened once before but immediately went away. Pt denies injury to the ankle or hx of gout. Redness noted to lt ankle and swelling.

## 2023-03-27 NOTE — Plan of Care (Signed)

## 2023-03-27 NOTE — H&P (Signed)
History and Physical    Shawn Fowler:[id number] DOB: [birth year] DOA: 03/27/2023  PCP: Billie Lade, MD   Patient coming from: Home  Chief Complaint: Left ankle swelling and pain  HPI: Shawn Fowler is a 32 y.o. male with medical history significant for ADHD, asthma, and THC use who presented to the ED with complaints of left ankle pain.  This apparently began at around 2 AM on the morning of admission and was sudden onset with some swelling noted.  He is having difficulty ambulating and putting weight on his left ankle and denies any fevers or chills or trauma.   ED Course: Vital signs stable and patient is afebrile.  Leukocytosis of 14,800 noted.  Left foot x-ray with no acute findings.  Patient has been started on Ancef and had joint aspiration with no overt signs of infection noted.  EDP discussed with Dr. Romeo Apple orthopedics who recommends overnight admission on IV antibiotics.  Review of Systems: Reviewed as noted above, otherwise negative.  Past Medical History:  Diagnosis Date   ADHD    Asthma     Past Surgical History:  Procedure Laterality Date   MOUTH SURGERY     TONSILLECTOMY     WRIST SURGERY Left      reports that he has been smoking e-cigarettes and cigarettes. He started smoking about 13 years ago. He has a 10 pack-year smoking history. He has never used smokeless tobacco. He reports current alcohol use. He reports that he does not currently use drugs after having used the following drugs: Amphetamines, Marijuana, Cocaine, and Other-see comments.  Allergies  Allergen Reactions   Penicillins Anaphylaxis   Aspirin Swelling    Family History  Problem Relation Age of Onset   Bipolar disorder Mother    Heart Problems Mother    Heart Problems Father    Kidney disease Maternal Uncle    Heart Problems Maternal Grandmother    Lung cancer Maternal Grandmother    Heart Problems Maternal Grandfather    Heart Problems Paternal Grandmother    Heart Problems  Paternal Grandfather        Passed from brain tumor   Cancer - Prostate Neg Hx    Colon cancer Neg Hx     Prior to Admission medications   Medication Sig Start Date End Date Taking? Authorizing Provider  albuterol (VENTOLIN HFA) 108 (90 Base) MCG/ACT inhaler Inhale 2 puffs into the lungs every 6 (six) hours as needed for wheezing or shortness of breath. 09/15/21   Paseda, Baird Kay, FNP  amLODipine (NORVASC) 2.5 MG tablet Take 1 tablet (2.5 mg total) by mouth daily. 09/15/21   Donell Beers, FNP  amphetamine-dextroamphetamine (ADDERALL XR) 5 MG 24 hr capsule Take 5 mg by mouth daily.    [provider]  azithromycin (ZITHROMAX) 250 MG tablet Take first 2 tablets together, then 1 every day until finished. 08/24/22   Particia Nearing, PA-C  budesonide-formoterol [facility]) 80-4.5 MCG/ACT inhaler Inhale 2 puffs into the lungs 2 (two) times daily. 09/15/21   Donell Beers, FNP  cetirizine (ZYRTEC ALLERGY) 10 MG tablet Take 1 tablet (10 mg total) by mouth daily. 08/16/21   Particia Nearing, PA-C  cetirizine (ZYRTEC ALLERGY) 10 MG tablet Take 1 tablet (10 mg total) by mouth daily. 08/24/22   Particia Nearing, PA-C  DULoxetine (CYMBALTA) 20 MG capsule Take 1 capsule (20 mg total) by mouth daily. 11/22/21   Ocie Doyne, MD  fluticasone (FLONASE) 50 MCG/ACT nasal spray  Place 1 spray into both nostrils 2 (two) times daily. 12/05/21   Paseda, Baird Kay, FNP  fluticasone (FLONASE) 50 MCG/ACT nasal spray Place 1 spray into both nostrils 2 (two) times daily. 08/24/22   Particia Nearing, PA-C  ibuprofen (ADVIL) 400 MG tablet Take 1 tablet (400 mg total) by mouth daily as needed. 10/13/21   Donell Beers, FNP  LORazepam (ATIVAN) 0.5 MG tablet Take 1-2 pills 30 minutes prior to MRI 01/05/22   Ocie Doyne, MD  methocarbamol (ROBAXIN) 500 MG tablet Take 1 tablet (500 mg total) by mouth every 8 (eight) hours as needed for muscle spasms (headaches). 11/22/21   Ocie Doyne, MD    Physical Exam: Vitals:   03/27/23 0734 03/27/23 1143 03/27/23 1145 03/27/23 1215  BP: (!) 140/91 121/69 115/61 123/76  Pulse: (!) 109 84 86 89  Resp: 20 18    Temp: 98.2 F (36.8 C) 97.8 F (36.6 C)    TempSrc: Oral Oral    SpO2: 98% 96% 97% 95%  Weight: 128.4 kg     Height: 6\' 2"  (1.88 m)       Constitutional: NAD, calm, comfortable, obese Vitals:   03/27/23 0734 03/27/23 1143 03/27/23 1145 03/27/23 1215  BP: (!) 140/91 121/69 115/61 123/76  Pulse: (!) 109 84 86 89  Resp: 20 18    Temp: 98.2 F (36.8 C) 97.8 F (36.6 C)    TempSrc: Oral Oral    SpO2: 98% 96% 97% 95%  Weight: 128.4 kg     Height: 6\' 2"  (1.88 m)      Eyes: lids and conjunctivae normal Neck: normal, supple Respiratory: clear to auscultation bilaterally. Normal respiratory effort. No accessory muscle use.  Cardiovascular: Regular rate and rhythm, no murmurs. Abdomen: no tenderness, no distention. Bowel sounds positive.  Musculoskeletal: Left ankle swelling and erythema Skin: no rashes, lesions, ulcers.  Psychiatric: Flat affect  Labs on Admission: I have personally reviewed following labs and imaging studies  CBC: Recent Labs  Lab 03/27/23 0816  WBC 14.8*  NEUTROABS 10.2*  HGB 12.5*  HCT 37.1*  MCV 86.7  PLT 229   Basic Metabolic Panel: Recent Labs  Lab 03/27/23 0816  NA 136  K 3.5  CL 102  CO2 26  GLUCOSE 108*  BUN 12  CREATININE 0.75  CALCIUM 8.7*   GFR: Estimated Creatinine Clearance: 194.1 mL/min (by C-G formula based on SCr of 0.75 mg/dL). Liver Function Tests: Recent Labs  Lab 03/27/23 0816  AST 21  ALT 22  ALKPHOS 78  BILITOT 0.4  PROT 6.4*  ALBUMIN 4.0   No results for input(s): "LIPASE", "AMYLASE" in the last 168 hours. No results for input(s): "AMMONIA" in the last 168 hours. Coagulation Profile: No results for input(s): "INR", "PROTIME" in the last 168 hours. Cardiac Enzymes: No results for input(s): "CKTOTAL", "CKMB", "CKMBINDEX",  "TROPONINI" in the last 168 hours. BNP (last 3 results) No results for input(s): "PROBNP" in the last 8760 hours. HbA1C: No results for input(s): "HGBA1C" in the last 72 hours. CBG: No results for input(s): "GLUCAP" in the last 168 hours. Lipid Profile: No results for input(s): "CHOL", "HDL", "LDLCALC", "TRIG", "CHOLHDL", "LDLDIRECT" in the last 72 hours. Thyroid Function Tests: No results for input(s): "TSH", "T4TOTAL", "FREET4", "T3FREE", "THYROIDAB" in the last 72 hours. Anemia Panel: No results for input(s): "VITAMINB12", "FOLATE", "FERRITIN", "TIBC", "IRON", "RETICCTPCT" in the last 72 hours. Urine analysis:    Component Value Date/Time   COLORURINE YELLOW (A) 07/16/2018 1812   APPEARANCEUR  CLEAR (A) 07/16/2018 1812   LABSPEC 1.016 07/16/2018 1812   PHURINE 6.0 07/16/2018 1812   GLUCOSEU NEGATIVE 07/16/2018 1812   HGBUR NEGATIVE 07/16/2018 1812   BILIRUBINUR NEGATIVE 07/16/2018 1812   KETONESUR NEGATIVE 07/16/2018 1812   PROTEINUR NEGATIVE 07/16/2018 1812   NITRITE NEGATIVE 07/16/2018 1812   LEUKOCYTESUR NEGATIVE 07/16/2018 1812    Radiological Exams on Admission: DG Foot Complete Left  Result Date: 03/27/2023 CLINICAL DATA:  [id number] Ankle pain, left [phone] and swelling EXAM: LEFT FOOT - COMPLETE 3+ VIEW COMPARISON:  10/18/2008 FINDINGS: There is no evidence of fracture or dislocation. Small calcaneal spur. There is no evidence of arthropathy or other focal bone abnormality. Soft tissues are unremarkable. IMPRESSION: Negative. Electronically Signed   By: Corlis Leak M.D.   On: 03/27/2023 07:53     Assessment/Plan Principal Problem:   Cellulitis Active Problems:   Mild cannabis use disorder   Attention deficit hyperactivity disorder, combined type, By History    Obesity (BMI 35.0-39.9 without comorbidity)    Left ankle/foot cellulitis Continue on Ancef as started in ED Monitor fluid and blood cultures with joint aspiration performed in ED Monitor CBC Anticipate  transition to oral antibiotics with likely discharge in the next 24 hours  History of asthma Continue breathing treatments as needed No active bronchospasms   History of ADHD Continue home medications  Obesity BMI 36.34  Tobacco abuse Smokes about 1 pack/day Counseled on cessation Nicotine patch  History of marijuana use   DVT prophylaxis: Lovenox Code Status: Full Family Communication: None at bedside Disposition Plan:Admit for IV abx Consults called:Ortho Dr. Romeo Apple by EDP Admission status: Obs, Med/Surg  Severity of Illness: The appropriate patient status for this patient is OBSERVATION. Observation status is judged to be reasonable and necessary in order to provide the required intensity of service to ensure the patient's safety. The patient's presenting symptoms, physical exam findings, and initial radiographic and laboratory data in the context of their medical condition is felt to place them at decreased risk for further clinical deterioration. Furthermore, it is anticipated that the patient will be medically stable for discharge from the hospital within 2 midnights of admission.    Cyndie Woodbeck D Sherryll Burger DO [facility]  If 7PM-7AM, please contact night-coverage [email or web address]  03/27/2023, 1:47 PM

## 2023-03-28 DIAGNOSIS — L03116 Cellulitis of left lower limb: Secondary | ICD-10-CM | POA: Diagnosis not present

## 2023-03-28 LAB — BASIC METABOLIC PANEL
Anion gap: 6 (ref 5–15)
BUN: 8 mg/dL (ref 6–20)
CO2: 27 mmol/L (ref 22–32)
Calcium: 8.3 mg/dL — ABNORMAL LOW (ref 8.9–10.3)
Chloride: 104 mmol/L (ref 98–111)
Creatinine, Ser: 0.65 mg/dL (ref 0.61–1.24)
GFR, Estimated: >60 mL/min (ref >60)
Glucose, Bld: 89 mg/dL (ref 70–99)
Potassium: 3.6 mmol/L (ref 3.5–5.1)
Sodium: 137 mmol/L (ref 135–145)

## 2023-03-28 LAB — CBC
HCT: 37.2 % — ABNORMAL LOW (ref 39.0–52.0)
Hemoglobin: 12.1 g/dL — ABNORMAL LOW (ref 13.0–17.0)
MCH: 28.9 pg (ref 26.0–34.0)
MCHC: 32.5 g/dL (ref 30.0–36.0)
MCV: 89 fL (ref 80.0–100.0)
Platelets: 186 10*3/uL (ref 150–400)
RBC: 4.18 MIL/uL — ABNORMAL LOW (ref 4.22–5.81)
RDW: 13.4 % (ref 11.5–15.5)
WBC: 8 10*3/uL (ref 4.0–10.5)
nRBC: 0 % (ref 0.0–0.2)

## 2023-03-28 LAB — HIV ANTIBODY (ROUTINE TESTING W REFLEX): HIV Screen 4th Generation wRfx: NONREACTIVE

## 2023-03-28 LAB — MAGNESIUM: Magnesium: 2 mg/dL (ref 1.7–2.4)

## 2023-03-28 LAB — C-REACTIVE PROTEIN: CRP: 1.5 mg/dL — ABNORMAL HIGH (ref <1.0)

## 2023-03-28 MED ORDER — DOXYCYCLINE HYCLATE 100 MG PO TBEC: 100.0000 mg | DELAYED_RELEASE_TABLET | Freq: Two times a day (BID) | ORAL | 0 refills | Status: AC

## 2023-03-28 NOTE — Discharge Summary (Signed)
Physician Discharge Summary  Shawn Fowler AVW:[id number] DOB: 1990-09-16 DOA: 03/27/2023  PCP: Billie Lade, MD  Admit date: 03/27/2023  Discharge date: 03/28/2023  Admitted From: Home  Disposition: Home  Recommendations for Outpatient Follow-up:  Follow up with PCP in 1-2 weeks Remain on doxycycline as prescribed for 7 days to complete course of treatment for cellulitis Continue other home medications as prior Encouraged tobacco cessation  Home Health: None  Equipment/Devices: None  Discharge Condition:Stable  CODE STATUS: Full  Diet recommendation: Heart Healthy  Brief/Interim Summary: Shawn Fowler is a 32 y.o. male with medical history significant for ADHD, asthma, and THC use who presented to the ED with complaints of left ankle pain.  This apparently began at around 2 AM on the morning of admission and was sudden onset with some swelling noted.  He was having some difficulty ambulating.  Arthrocentesis performed in the ED with no significant findings on Gram stain noted.  He is now able to ambulate and denies any significant pain after IV antibiotics overnight.  He will be transition to oral antibiotics and is stable for discharge at this time with no other acute events noted.  Leukocytosis has resolved as well.  Discharge Diagnoses:  Principal Problem:   Cellulitis Active Problems:   Mild cannabis use disorder   Attention deficit hyperactivity disorder, combined type, By History    Obesity (BMI 35.0-39.9 without comorbidity)  Principal discharge diagnosis: Left ankle cellulitis-improved.  Discharge Instructions  Discharge Instructions     Diet - low sodium heart healthy   Complete by: As directed    Increase activity slowly   Complete by: As directed       Allergies as of 03/28/2023       Reactions   Penicillins Anaphylaxis, Itching   Aspirin Swelling        Medication List     TAKE these medications    albuterol 108 (90 Base) MCG/ACT  inhaler Commonly known as: VENTOLIN HFA Inhale 2 puffs into the lungs every 6 (six) hours as needed for wheezing or shortness of breath.   amphetamine-dextroamphetamine 20 MG 24 hr capsule Commonly known as: ADDERALL XR Take 20 mg by mouth in the morning and at bedtime.   buprenorphine 8 MG Subl SL tablet Commonly known as: SUBUTEX Place 8 mg under the tongue in the morning, at noon, and at bedtime.   doxycycline 100 MG EC tablet Commonly known as: DORYX Take 1 tablet (100 mg total) by mouth 2 (two) times daily for 7 days.   ibuprofen 800 MG tablet Commonly known as: ADVIL Take 1 tablet by mouth every 8 (eight) hours as needed for headache.        Follow-up Information     Billie Lade, MD. Schedule an appointment as soon as possible for a visit in 1 week(s).   Specialty: Internal Medicine Contact information: [address] [place] [address] [phone]                Allergies  Allergen Reactions   Penicillins Anaphylaxis and Itching   Aspirin Swelling    Consultations: None   Procedures/Studies: DG Foot Complete Left  Result Date: 03/27/2023 CLINICAL DATA:  [id number] Ankle pain, left [phone] and swelling EXAM: LEFT FOOT - COMPLETE 3+ VIEW COMPARISON:  10/18/2008 FINDINGS: There is no evidence of fracture or dislocation. Small calcaneal spur. There is no evidence of arthropathy or other focal bone abnormality. Soft tissues are unremarkable. IMPRESSION: Negative. Electronically Signed  By: Corlis Leak M.D.   On: 03/27/2023 07:53     Discharge Exam: Vitals:   03/28/23 0022 03/28/23 0428  BP: 127/81 115/66  Pulse: 78 65  Resp: 16 16  Temp: 98.3 F (36.8 C) 97.8 F (36.6 C)  SpO2: 98% 97%   Vitals:   03/27/23 1611 03/27/23 2046 03/28/23 0022 03/28/23 0428  BP: 121/75 121/77 127/81 115/66  Pulse: 83 70 78 65  Resp: 20 17 16 16   Temp: 98.3 F (36.8 C) 98 F (36.7 C) 98.3 F (36.8 C) 97.8 F (36.6 C)  TempSrc: Oral  Oral   SpO2: 98%  98% 98% 97%  Weight:      Height:        General: Pt is alert, awake, not in acute distress Cardiovascular: RRR, S1/S2 +, no rubs, no gallops Respiratory: CTA bilaterally, no wheezing, no rhonchi Abdominal: Soft, NT, ND, bowel sounds + Extremities: Left ankle erythema improved with less edema.    The results of significant diagnostics from this hospitalization (including imaging, microbiology, ancillary and laboratory) are listed below for reference.     Microbiology: Recent Results (from the past 240 hour(s))  Body fluid culture w Gram Stain     Status: None (Preliminary result)   Collection Time: 03/27/23 12:14 PM   Specimen: Ankle; Body Fluid  Result Value Ref Range Status   Specimen Description   Final    ANKLE Performed at [facility], [address], [place], [place] [address]    Special Requests   Final    NONE Performed at [facility], [address] [place], [place] [address]    Gram Stain   Final    NO WBC SEEN NO ORGANISMS SEEN Performed at [facility], [address] [place], [place] [address]    Culture PENDING  Incomplete   Report Status PENDING  Incomplete     Labs: BNP (last 3 results) No results for input(s): "BNP" in the last 8760 hours. Basic Metabolic Panel: Recent Labs  Lab 03/27/23 0816 03/28/23 0429  NA 136 137  K 3.5 3.6  CL 102 104  CO2 26 27  GLUCOSE 108* 89  BUN 12 8  CREATININE 0.75 0.65  CALCIUM 8.7* 8.3*  MG  --  2.0   Liver Function Tests: Recent Labs  Lab 03/27/23 0816  AST 21  ALT 22  ALKPHOS 78  BILITOT 0.4  PROT 6.4*  ALBUMIN 4.0   No results for input(s): "LIPASE", "AMYLASE" in the last 168 hours. No results for input(s): "AMMONIA" in the last 168 hours. CBC: Recent Labs  Lab 03/27/23 0816 03/28/23 0429  WBC 14.8* 8.0  NEUTROABS 10.2*  --   HGB 12.5* 12.1*  HCT 37.1* 37.2*  MCV 86.7 89.0  PLT 229 186   Cardiac Enzymes: No results for input(s): "CKTOTAL", "CKMB", "CKMBINDEX", "TROPONINI" in  the last 168 hours. BNP: Invalid input(s): "POCBNP" CBG: No results for input(s): "GLUCAP" in the last 168 hours. D-Dimer No results for input(s): "DDIMER" in the last 72 hours. Hgb A1c No results for input(s): "HGBA1C" in the last 72 hours. Lipid Profile No results for input(s): "CHOL", "HDL", "LDLCALC", "TRIG", "CHOLHDL", "LDLDIRECT" in the last 72 hours. Thyroid function studies No results for input(s): "TSH", "T4TOTAL", "T3FREE", "THYROIDAB" in the last 72 hours.  Invalid input(s): "FREET3" Anemia work up No results for input(s): "VITAMINB12", "FOLATE", "FERRITIN", "TIBC", "IRON", "RETICCTPCT" in the last 72 hours. Urinalysis    Component Value Date/Time   COLORURINE YELLOW (A)  07/16/2018 1812   APPEARANCEUR CLEAR (A) 07/16/2018 1812   LABSPEC 1.016 07/16/2018 1812   PHURINE 6.0 07/16/2018 1812   GLUCOSEU NEGATIVE 07/16/2018 1812   HGBUR NEGATIVE 07/16/2018 1812   BILIRUBINUR NEGATIVE 07/16/2018 1812   KETONESUR NEGATIVE 07/16/2018 1812   PROTEINUR NEGATIVE 07/16/2018 1812   NITRITE NEGATIVE 07/16/2018 1812   LEUKOCYTESUR NEGATIVE 07/16/2018 1812   Sepsis Labs Recent Labs  Lab 03/27/23 0816 03/28/23 0429  WBC 14.8* 8.0   Microbiology Recent Results (from the past 240 hour(s))  Body fluid culture w Gram Stain     Status: None (Preliminary result)   Collection Time: 03/27/23 12:14 PM   Specimen: Ankle; Body Fluid  Result Value Ref Range Status   Specimen Description   Final    ANKLE Performed at [facility], [address], [place], [place] [address]    Special Requests   Final    NONE Performed at [facility], [address], [place], [place] [address]    Gram Stain   Final    NO WBC SEEN NO ORGANISMS SEEN Performed at [facility], [address], [place], [place] [address]    Culture PENDING  Incomplete   Report Status PENDING  Incomplete     Time coordinating discharge: 35 minutes  SIGNED:   Erick Blinks, DO [facility] 03/28/2023, 8:40 AM  If 7PM-7AM, please contact night-coverage [email or web address]

## 2023-03-28 NOTE — Progress Notes (Signed)
PT Cancellation Note  Patient Details Name: Shawn Fowler MRN: [id number] DOB: 1991-06-30   Cancelled Treatment:    Reason Eval/Treat Not Completed: PT screened, no needs identified, will sign off. Patient walking independently in room.  9:53 AM, 03/28/23 Ocie Bob, MPT Physical Therapist with [facility] [phone] office [phone] mobile phone

## 2023-03-28 NOTE — Progress Notes (Signed)
   03/28/23 1023  TOC Brief Assessment  Insurance and Status Reviewed  Patient has primary care physician Yes  Prior/Current Home Services No current home services  Social Determinants of Health Reivew SDOH reviewed no interventions necessary  Readmission risk has been reviewed Yes  Transition of care needs no transition of care needs at this time    Transition of Care Department [facility]) has reviewed patient and no TOC needs have been identified at this time. We will continue to monitor patient advancement through interdisciplinary progression rounds. If new patient transition needs arise, please place a TOC consult.

## 2023-03-28 NOTE — Progress Notes (Signed)
Patient allergic to penicillins. IV ancef given. Spoke to [employer] pharmacist and oncall MD. Molli Knock to give just monitor for reactions. No reactions noted.

## 2023-03-29 ENCOUNTER — Telehealth: Payer: Self-pay

## 2023-03-29 NOTE — Transitions of Care (Post Inpatient/ED Visit) (Unsigned)
   03/29/2023  Name: Shawn Fowler MRN: [id number] DOB: 1990-09-30  Today's TOC FU Call Status: Today's TOC FU Call Status:: Unsuccessful Call (1st Attempt) Unsuccessful Call (1st Attempt) Date: 03/29/23  Attempted to reach the patient regarding the most recent Inpatient/ED visit.  Follow Up Plan: Additional outreach attempts will be made to reach the patient to complete the Transitions of Care (Post Inpatient/ED visit) call.   Signature Karena Addison, LPN [facility] Nurse Health Advisor Direct Dial [phone]

## 2023-03-31 LAB — BODY FLUID CULTURE W GRAM STAIN
Culture: NO GROWTH
Gram Stain: NONE SEEN

## 2023-04-01 LAB — CULTURE, BLOOD (ROUTINE X 2)
Culture: NO GROWTH
Culture: NO GROWTH

## 2023-04-03 NOTE — Transitions of Care (Post Inpatient/ED Visit) (Unsigned)
   04/03/2023  Name: Shawn Fowler MRN: [id number] DOB: 27-Sep-1990  Today's TOC FU Call Status: Today's TOC FU Call Status:: Unsuccessful Call (2nd Attempt) Unsuccessful Call (1st Attempt) Date: 03/29/23 Unsuccessful Call (2nd Attempt) Date: 04/03/23  Attempted to reach the patient regarding the most recent Inpatient/ED visit.  Follow Up Plan: Additional outreach attempts will be made to reach the patient to complete the Transitions of Care (Post Inpatient/ED visit) call.   Signature  Karena Addison, LPN [facility] Nurse Health Advisor Direct Dial [phone]

## 2023-04-04 NOTE — Transitions of Care (Post Inpatient/ED Visit) (Signed)
   04/04/2023  Name: Shawn Fowler MRN: [id number] DOB: 1991-04-24  Today's TOC FU Call Status: Today's TOC FU Call Status:: Unsuccessful Call (3rd Attempt) Unsuccessful Call (1st Attempt) Date: 03/29/23 Unsuccessful Call (2nd Attempt) Date: 04/03/23 Unsuccessful Call (3rd Attempt) Date: 04/04/23  Attempted to reach the patient regarding the most recent Inpatient/ED visit.  Follow Up Plan: No further outreach attempts will be made at this time. We have been unable to contact the patient.  Signature Karena Addison, LPN [facility] Nurse Health Advisor Direct Dial [phone]

## 2023-05-04 ENCOUNTER — Other Ambulatory Visit: Payer: Self-pay

## 2023-05-04 ENCOUNTER — Encounter (HOSPITAL_COMMUNITY): Payer: Self-pay | Admitting: Emergency Medicine

## 2023-05-04 ENCOUNTER — Emergency Department (HOSPITAL_COMMUNITY)
Admission: EM | Admit: 2023-05-04 | Discharge: 2023-05-04 | Disposition: A | Discharge status: Home / Self Care | Payer: Medicaid Other | Attending: Emergency Medicine | Admitting: Emergency Medicine

## 2023-05-04 DIAGNOSIS — M7989 Other specified soft tissue disorders: Secondary | ICD-10-CM | POA: Diagnosis present

## 2023-05-04 DIAGNOSIS — Z23 Encounter for immunization: Secondary | ICD-10-CM | POA: Insufficient documentation

## 2023-05-04 DIAGNOSIS — L03115 Cellulitis of right lower limb: Secondary | ICD-10-CM | POA: Insufficient documentation

## 2023-05-04 LAB — BASIC METABOLIC PANEL
Anion gap: 6 (ref 5–15)
BUN: 13 mg/dL (ref 6–20)
CO2: 30 mmol/L (ref 22–32)
Calcium: 8.4 mg/dL — ABNORMAL LOW (ref 8.9–10.3)
Chloride: 99 mmol/L (ref 98–111)
Creatinine, Ser: 0.97 mg/dL (ref 0.61–1.24)
GFR, Estimated: >60 mL/min (ref >60)
Glucose, Bld: 96 mg/dL (ref 70–99)
Potassium: 3.6 mmol/L (ref 3.5–5.1)
Sodium: 135 mmol/L (ref 135–145)

## 2023-05-04 LAB — CBC WITH DIFFERENTIAL/PLATELET
Abs Immature Granulocytes: 0.06 10*3/uL (ref 0.00–0.07)
Basophils Absolute: 0 10*3/uL (ref 0.0–0.1)
Basophils Relative: 0 %
Eosinophils Absolute: 0.2 10*3/uL (ref 0.0–0.5)
Eosinophils Relative: 2 %
HCT: 37.2 % — ABNORMAL LOW (ref 39.0–52.0)
Hemoglobin: 12.5 g/dL — ABNORMAL LOW (ref 13.0–17.0)
Immature Granulocytes: 1 %
Lymphocytes Relative: 19 %
Lymphs Abs: 2.3 10*3/uL (ref 0.7–4.0)
MCH: 30 pg (ref 26.0–34.0)
MCHC: 33.6 g/dL (ref 30.0–36.0)
MCV: 89.2 fL (ref 80.0–100.0)
Monocytes Absolute: 1 10*3/uL (ref 0.1–1.0)
Monocytes Relative: 9 %
Neutro Abs: 8.1 10*3/uL — ABNORMAL HIGH (ref 1.7–7.7)
Neutrophils Relative %: 69 %
Platelets: 191 10*3/uL (ref 150–400)
RBC: 4.17 MIL/uL — ABNORMAL LOW (ref 4.22–5.81)
RDW: 13.1 % (ref 11.5–15.5)
WBC: 11.7 10*3/uL — ABNORMAL HIGH (ref 4.0–10.5)
nRBC: 0 % (ref 0.0–0.2)

## 2023-05-04 LAB — D-DIMER, QUANTITATIVE: D-Dimer, Quant: 0.33 ug/mL-FEU (ref 0.00–0.50)

## 2023-05-04 MED ORDER — DOXYCYCLINE HYCLATE 100 MG PO CAPS: 100.0000 mg | ORAL_CAPSULE | Freq: Two times a day (BID) | ORAL | 0 refills | Status: DC

## 2023-05-04 MED ORDER — DOXYCYCLINE HYCLATE 100 MG PO TABS
100.0000 mg | ORAL_TABLET | Freq: Once | ORAL | Status: AC
  Administered 2023-05-04: 100 mg via ORAL
  Filled 2023-05-04: qty 1

## 2023-05-04 MED ORDER — IBUPROFEN 800 MG PO TABS
800.0000 mg | ORAL_TABLET | Freq: Once | ORAL | Status: AC
  Administered 2023-05-04: 800 mg via ORAL
  Filled 2023-05-04: qty 1

## 2023-05-04 MED ORDER — TETANUS-DIPHTH-ACELL PERTUSSIS 5-2.5-18.5 LF-MCG/0.5 IM SUSY
0.5000 mL | PREFILLED_SYRINGE | Freq: Once | INTRAMUSCULAR | Status: AC
  Administered 2023-05-04: 0.5 mL via INTRAMUSCULAR
  Filled 2023-05-04: qty 0.5

## 2023-05-04 NOTE — ED Provider Notes (Signed)
[place] EMERGENCY DEPARTMENT AT [facility] Provider Note   CSN: [id number] Arrival date & time: 05/04/23  2121     History  Chief Complaint  Patient presents with   Leg Swelling    Bilateral    Shawn Fowler is a 32 y.o. male, history of cellulitis, who presents to the ED secondary to swelling of his right lower extremity, has been going on for the last day, he states it is red hot and swollen.  Notes that he had cellulitis in the left leg, last month, and that his foot is still swollen, but his leg is no longer swollen.  Denies any IV drug use.  He is currently on Suboxone.  Has been compliant.  States he does have dogs, and get scratched frequently.  States is very tender and hard to walk on.  Denies any trauma.  Denies any chest pain or shortness of breath.    Home Medications Prior to Admission medications   Medication Sig Start Date End Date Taking? Authorizing Provider  doxycycline (VIBRAMYCIN) 100 MG capsule Take 1 capsule (100 mg total) by mouth 2 (two) times daily. 05/04/23  Yes Jeray Shugart L, PA  albuterol (VENTOLIN HFA) 108 (90 Base) MCG/ACT inhaler Inhale 2 puffs into the lungs every 6 (six) hours as needed for wheezing or shortness of breath. 09/15/21   Donell Beers, FNP  amphetamine-dextroamphetamine (ADDERALL XR) 20 MG 24 hr capsule Take 20 mg by mouth in the morning and at bedtime.    [provider]  buprenorphine (SUBUTEX) 8 MG SUBL SL tablet Place 8 mg under the tongue in the morning, at noon, and at bedtime.    [provider]  ibuprofen (ADVIL) 800 MG tablet Take 1 tablet by mouth every 8 (eight) hours as needed for headache.    [provider]      Allergies    Penicillins and Aspirin    Review of Systems   Review of Systems  Respiratory:  Negative for shortness of breath.   Cardiovascular:  Positive for leg swelling.    Physical Exam Updated Vital Signs BP (!) 149/83 (BP Location: Right Arm)   Pulse (!) 108    Temp 98.7 F (37.1 C) (Oral)   Resp 18   Ht 6\' 2"  (1.88 m)   Wt 128.4 kg   SpO2 100%   BMI 36.34 kg/m  Physical Exam Vitals and nursing note reviewed.  Constitutional:      General: He is not in acute distress.    Appearance: He is well-developed.  HENT:     Head: Normocephalic and atraumatic.  Eyes:     Conjunctiva/sclera: Conjunctivae normal.  Cardiovascular:     Rate and Rhythm: Normal rate and regular rhythm.     Heart sounds: No murmur heard. Pulmonary:     Effort: Pulmonary effort is normal. No respiratory distress.     Breath sounds: Normal breath sounds.  Abdominal:     Palpations: Abdomen is soft.     Tenderness: There is no abdominal tenderness.  Musculoskeletal:        General: No swelling.     Cervical back: Neck supple.  Skin:    General: Skin is warm and dry.     Capillary Refill: Capillary refill takes less than 2 seconds.     Comments: Erythema of the left foot, as well as the right lower leg.  With tenderness to palpation of both the anterior and posterior aspect of the right lower  leg.  1+ pitting edema the right lower extremity.  Positive dorsalis pedis pulse.  Good sensation and range of motion intact.  Scratches noted on the right lower extremity  Neurological:     Mental Status: He is alert.  Psychiatric:        Mood and Affect: Mood normal.     ED Results / Procedures / Treatments   Labs (all labs ordered are listed, but only abnormal results are displayed) Labs Reviewed  CBC WITH DIFFERENTIAL/PLATELET  BASIC METABOLIC PANEL  D-DIMER, QUANTITATIVE    EKG None  Radiology No results found.  Procedures Procedures    Medications Ordered in ED Medications  ibuprofen (ADVIL) tablet 800 mg (800 mg Oral Given 05/04/23 2219)  doxycycline (VIBRA-TABS) tablet 100 mg (100 mg Oral Given 05/04/23 2219)  Tdap (BOOSTRIX) injection 0.5 mL (0.5 mLs Intramuscular Given 05/04/23 2219)    ED Course/ Medical Decision Making/ A&P                                  Medical Decision Making Patient is a 32 year old male, here for right lower extremity swelling, has been going on for the last day.  He states is red and swollen, and tender to the touch.  Having pain with bearing weight.  We will obtain a D-dimer, to rule out a DVT, and blood work, to check his kidney function.  Plan is likely to discharge home on antibiotics with follow-up with PCP.  If dimer positive, will need outpatient DVT as it is nighttime, and ultrasound study is not available.  Tetanus updated given dog scratches and possible infection of right lower extremity, unknown last tetanus.  Dr. Bernette Mayers to follow-up on labs.  If positive D-dimer order outpatient study, give 1 dose of Lovenox.  Amount and/or Complexity of Data Reviewed Labs: ordered.  Risk Prescription drug management.    Final Clinical Impression(s) / ED Diagnoses Final diagnoses:  Cellulitis of right lower extremity    Rx / DC Orders ED Discharge Orders          Ordered    doxycycline (VIBRAMYCIN) 100 MG capsule  2 times daily        05/04/23 2240              Neeley Sedivy, [place]ley [place], [place] 05/04/23 2258    Eber Hong, MD 05/06/23 [phone]

## 2023-05-04 NOTE — Discharge Instructions (Addendum)
Please follow-up with your primary care doctor, and return to the ER if you feel like your symptoms are worsening.  I have started you on an antibiotic, please make sure you take the antibiotic fully.  Return to the ER if you develop any kind of fevers, chills, worsening symptoms.

## 2023-05-04 NOTE — ED Triage Notes (Signed)
Pt presents with bilateral leg swelling and pain, admitted last month for lower leg cellulitis.

## 2023-05-10 ENCOUNTER — Emergency Department (HOSPITAL_COMMUNITY)
Admission: EM | Admit: 2023-05-10 | Discharge: 2023-05-10 | Disposition: A | Discharge status: Home / Self Care | Payer: Medicaid Other | Attending: Emergency Medicine | Admitting: Emergency Medicine

## 2023-05-10 ENCOUNTER — Other Ambulatory Visit: Payer: Self-pay

## 2023-05-10 DIAGNOSIS — J45909 Unspecified asthma, uncomplicated: Secondary | ICD-10-CM | POA: Diagnosis not present

## 2023-05-10 DIAGNOSIS — L03115 Cellulitis of right lower limb: Secondary | ICD-10-CM | POA: Diagnosis not present

## 2023-05-10 DIAGNOSIS — M79604 Pain in right leg: Secondary | ICD-10-CM | POA: Diagnosis present

## 2023-05-10 LAB — CBC WITH DIFFERENTIAL/PLATELET
Abs Immature Granulocytes: 0.03 10*3/uL (ref 0.00–0.07)
Basophils Absolute: 0 10*3/uL (ref 0.0–0.1)
Basophils Relative: 1 %
Eosinophils Absolute: 0.2 10*3/uL (ref 0.0–0.5)
Eosinophils Relative: 2 %
HCT: 35.2 % — ABNORMAL LOW (ref 39.0–52.0)
Hemoglobin: 11.9 g/dL — ABNORMAL LOW (ref 13.0–17.0)
Immature Granulocytes: 0 %
Lymphocytes Relative: 35 %
Lymphs Abs: 3.1 10*3/uL (ref 0.7–4.0)
MCH: 29.8 pg (ref 26.0–34.0)
MCHC: 33.8 g/dL (ref 30.0–36.0)
MCV: 88.2 fL (ref 80.0–100.0)
Monocytes Absolute: 0.9 10*3/uL (ref 0.1–1.0)
Monocytes Relative: 10 %
Neutro Abs: 4.7 10*3/uL (ref 1.7–7.7)
Neutrophils Relative %: 52 %
Platelets: 246 10*3/uL (ref 150–400)
RBC: 3.99 MIL/uL — ABNORMAL LOW (ref 4.22–5.81)
RDW: 13 % (ref 11.5–15.5)
WBC: 8.9 10*3/uL (ref 4.0–10.5)
nRBC: 0 % (ref 0.0–0.2)

## 2023-05-10 LAB — BASIC METABOLIC PANEL
Anion gap: 7 (ref 5–15)
BUN: 16 mg/dL (ref 6–20)
CO2: 28 mmol/L (ref 22–32)
Calcium: 8.8 mg/dL — ABNORMAL LOW (ref 8.9–10.3)
Chloride: 106 mmol/L (ref 98–111)
Creatinine, Ser: 0.65 mg/dL (ref 0.61–1.24)
GFR, Estimated: >60 mL/min (ref >60)
Glucose, Bld: 106 mg/dL — ABNORMAL HIGH (ref 70–99)
Potassium: 3.4 mmol/L — ABNORMAL LOW (ref 3.5–5.1)
Sodium: 141 mmol/L (ref 135–145)

## 2023-05-10 MED ORDER — DEXTROSE 5 % IV SOLN
1500.0000 mg | Freq: Once | INTRAVENOUS | Status: AC
  Administered 2023-05-10: 1500 mg via INTRAVENOUS
  Filled 2023-05-10: qty 75

## 2023-05-10 MED ORDER — VANCOMYCIN HCL IN DEXTROSE 1-5 GM/200ML-% IV SOLN: 1000.0000 mg | Freq: Once | INTRAVENOUS | Status: DC

## 2023-05-10 MED ORDER — VANCOMYCIN HCL 2000 MG/400ML IV SOLN
2000.0000 mg | Freq: Once | INTRAVENOUS | Status: AC
  Administered 2023-05-10: 2000 mg via INTRAVENOUS
  Filled 2023-05-10: qty 400

## 2023-05-10 MED ORDER — DEXTROSE 5 % IV SOLN
1500.0000 mg | Freq: Once | INTRAVENOUS | Status: DC
  Filled 2023-05-10: qty 75

## 2023-05-10 NOTE — ED Notes (Signed)
Edp aware of abx finished. Edp at bedside.

## 2023-05-10 NOTE — Discharge Instructions (Addendum)
Return immediately if develop fevers, chills, worsening redness that is spreading further up your leg, severe pain, or any new or worsening symptoms that are concerning to you.

## 2023-05-10 NOTE — ED Triage Notes (Signed)
Pt presents to ED from home with LLE edema and redness, seen here a week ago for the same, Dx with cellulitis and discharged home on doxy, pt says he took all of it with no improvement.

## 2023-05-10 NOTE — Consult Note (Signed)
Pharmacy Antibiotic Note  ASSESSMENT: 32 y.o. male with PMH ADHD, asthma is presenting with cellulitis of the right leg. He endorses worsening right leg pain, redness, and swelling. Taking prescribed doxycycline for past 6 days which has not been effective, and patient was hospitalized earlier this month for left leg cellulitis. He is afebrile, VSS, with no leukocytosis. Per ED provider's discussion with  infectious diseases provider Dr. Daiva Eves, pharmacy has been consulted to manage dalbavancin dosing.  Patient measurements: Height: 6\' 2"  (188 cm) Weight: 117.9 kg (260 lb) IBW/kg (Calculated) : 82.2  Vital signs: Temp: 98.7 F (37.1 C) (09/27 0530) Temp Source: Oral (09/27 0530) BP: 108/63 (09/27 0700) Pulse Rate: 76 (09/27 0700) Recent Labs  Lab 05/04/23 2238 05/10/23 0409  WBC 11.7* 8.9  CREATININE 0.97 0.65   Estimated Creatinine Clearance: 186 mL/min (by C-G formula based on SCr of 0.65 mg/dL).  Allergies: Allergies  Allergen Reactions   Penicillins Anaphylaxis and Itching   Aspirin Swelling    Antimicrobials this admission: Vancomycin 9/27 x 1 Dalbavancin 9/27 x 1  Dose adjustments this admission: N/A  Microbiology results: N/A   PLAN: Administer dalbavancin 1.5 g x 1 as a single dose Follow up via ambulatory infectious diseases referral as outpatient   Thank you for allowing pharmacy to be a part of this patient's care.  Will M. Dareen Piano, PharmD Clinical Pharmacist 05/10/2023 7:37 AM

## 2023-05-10 NOTE — ED Notes (Signed)
Spoke with pharmacist at [facility], [place] who says she is sending message to our pharmacist coming in this morning regarding this medication.

## 2023-05-10 NOTE — ED Provider Notes (Signed)
EMERGENCY DEPARTMENT AT [facility] Provider Note   CSN: [id number] Arrival date & time: 05/10/23  0155     History  Chief Complaint  Patient presents with   Cellulitis    LLE    CURLY MACKOWSKI is a 32 y.o. male.  Patient is a 32 year old male with history of ADHD and asthma.  Patient presenting today for evaluation of right leg pain, redness, and swelling.  This has been worsening over the past week.  He has been taking doxycycline for the past 6 days with no improvement.  Patient was hospitalized earlier this month for IV antibiotics for cellulitis in the other leg.  He denies to me he is having any fevers or chills.  No chest pain or difficulty breathing.  The history is provided by the patient.       Home Medications Prior to Admission medications   Medication Sig Start Date End Date Taking? Authorizing Provider  albuterol (VENTOLIN HFA) 108 (90 Base) MCG/ACT inhaler Inhale 2 puffs into the lungs every 6 (six) hours as needed for wheezing or shortness of breath. 09/15/21   Donell Beers, FNP  amphetamine-dextroamphetamine (ADDERALL XR) 20 MG 24 hr capsule Take 20 mg by mouth in the morning and at bedtime.    [provider]  buprenorphine (SUBUTEX) 8 MG SUBL SL tablet Place 8 mg under the tongue in the morning, at noon, and at bedtime.    [provider]  doxycycline (VIBRAMYCIN) 100 MG capsule Take 1 capsule (100 mg total) by mouth 2 (two) times daily. 05/04/23   Small, Brooke L, PA  ibuprofen (ADVIL) 800 MG tablet Take 1 tablet by mouth every 8 (eight) hours as needed for headache.    [provider]      Allergies    Penicillins and Aspirin    Review of Systems   Review of Systems  All other systems reviewed and are negative.   Physical Exam Updated Vital Signs BP (!) 115/93 (BP Location: Right Arm)   Pulse 95   Temp 98 F (36.7 C) (Oral)   Resp 18   Ht 6\' 2"  (1.88 m)   Wt 117.9 kg   SpO2 100%   BMI 33.38  kg/m  Physical Exam Vitals and nursing note reviewed.  Constitutional:      Appearance: Normal appearance.  Pulmonary:     Effort: Pulmonary effort is normal.  Skin:    General: Skin is warm and dry.     Comments: The right lower extremity is noted to have erythema and warmth in a stocking distribution.  There is also 2+ pitting edema of the lower leg and foot.  DP pulses are palpable.  Neurological:     Mental Status: He is alert.     ED Results / Procedures / Treatments   Labs (all labs ordered are listed, but only abnormal results are displayed) Labs Reviewed - No data to display  EKG None  Radiology No results found.  Procedures Procedures    Medications Ordered in ED Medications  vancomycin (VANCOCIN) IVPB 1000 mg/200 mL premix (has no administration in time range)    ED Course/ Medical Decision Making/ A&P  Patient is a 32 year old male presenting with complaints of right leg pain, redness, and swelling consistent with cellulitis.  He just completed 6 days of doxycycline, however this is not helping.  Patient arrives here with a cellulitic leg, but is intact pulses.  Vital signs are stable and he is  clinically well-appearing otherwise.  Workup initiated including CBC and basic metabolic panel.  Patient has no leukocytosis and no significant electrolyte derangement.  Patient has received IV vancomycin.  As the patient seems to have failed outpatient antibiotics, I discussed care with the hospitalist for consideration of admission.  Dr. Carren Rang inquired about the possibility of Dalvance and discharge.  I have discussed this possibility with Dr. Daiva Eves from infectious disease.  He is in agreement that this seems like a reasonable option.  Patient will be administered Dalvance, then discharged to home.  Final Clinical Impression(s) / ED Diagnoses Final diagnoses:  None    Rx / DC Orders ED Discharge Orders     None         Geoffery Lyons,  MD 05/10/23 [phone]

## 2023-05-10 NOTE — ED Provider Notes (Signed)
Received signout; discharge after Dalvance for right lower extremity cellulitis  Reevaluated, no adverse effects from medication.  Patient comfortable discharge  Physical Exam  BP 107/65   Pulse 75   Temp 97.7 F (36.5 C) (Oral)   Resp 17   Ht 6\' 2"  (1.88 m)   Wt 117.9 kg   SpO2 95%   BMI 33.38 kg/m   Physical Exam Constitutional:      Appearance: He is not ill-appearing or toxic-appearing.  HENT:     Head: Normocephalic.  Cardiovascular:     Rate and Rhythm: Normal rate.  Pulmonary:     Effort: Pulmonary effort is normal.  Abdominal:     General: There is no distension.  Musculoskeletal:     Comments: Right lower extremity with cellulitis and some edema.  Good pulses normal sensation.  Neurological:     Mental Status: He is alert.     Procedures  Procedures  ED Course / MDM    Medical Decision Making 32 year old male with right lower extremity cellulitis.  Received signout from overnight team; see their note for further HPI.  Afebrile, nontachycardic.  Labs reviewed.  Reassuring.  Per overnight provider hospitalist and infectious disease recommending Dalvanceand discharge which patient received.  He feels comfortable going home.  Will discharge in stable condition.  Amount and/or Complexity of Data Reviewed Labs: ordered.  Risk Prescription drug management.         Coral Spikes, DO 05/10/23 1049

## 2023-05-14 ENCOUNTER — Ambulatory Visit (INDEPENDENT_AMBULATORY_CARE_PROVIDER_SITE_OTHER): Payer: Medicaid Other | Admitting: Internal Medicine

## 2023-05-14 ENCOUNTER — Other Ambulatory Visit: Payer: Self-pay

## 2023-05-14 VITALS — BP 133/79 | HR 83 | Temp 97.9°F | Wt 239.0 lb

## 2023-05-14 DIAGNOSIS — R600 Localized edema: Secondary | ICD-10-CM | POA: Diagnosis present

## 2023-05-14 MED ORDER — NAPROXEN 500 MG PO TABS: 500.0000 mg | ORAL_TABLET | Freq: Two times a day (BID) | ORAL | 0 refills | Status: AC

## 2023-05-14 NOTE — Progress Notes (Unsigned)
[facility]      Reason for Consult:cellulitis    Referring Physician: Dr. Judd Lien    Patient ID: Shawn Fowler, male    DOB: 11-Jan-1991, 32 y.o.   MRN: [id number]  HPI:   Shawn Fowler is here for evaluation for cellulitis.   He was seen in the ED on 9/21 and 9/27 for right lower leg erythema.  On exam noted to have erythema of the left foot and right lower leg.  No warmth described.  Some tenderness.  Started on doxycycline 9/21 then on 9/27 given dalbavancin.  Hospitalized in August with left leg cellulitis.     Past Medical History:  Diagnosis Date   ADHD    Asthma     Prior to Admission medications   Medication Sig Start Date End Date Taking? Authorizing Provider  albuterol (VENTOLIN HFA) 108 (90 Base) MCG/ACT inhaler Inhale 2 puffs into the lungs every 6 (six) hours as needed for wheezing or shortness of breath. 09/15/21   Donell Beers, FNP  amphetamine-dextroamphetamine (ADDERALL XR) 20 MG 24 hr capsule Take 20 mg by mouth in the morning and at bedtime.    [provider]  buprenorphine (SUBUTEX) 8 MG SUBL SL tablet Place 8 mg under the tongue in the morning, at noon, and at bedtime.    [provider]  doxycycline (VIBRAMYCIN) 100 MG capsule Take 1 capsule (100 mg total) by mouth 2 (two) times daily. 05/04/23   Small, Brooke L, PA  ibuprofen (ADVIL) 800 MG tablet Take 1 tablet by mouth every 8 (eight) hours as needed for headache.    [provider]    Allergies  Allergen Reactions   Penicillins Anaphylaxis and Itching   Aspirin Swelling    Social History   Tobacco Use   Smoking status: Some Days    Current packs/day: 0.00    Average packs/day: 1 pack/day for 10.0 years (10.0 ttl pk-yrs)    Types: E-cigarettes, Cigarettes    Start date: 09/15/2009    Last attempt to quit: 09/16/2019    Years since quitting: 3.6   Smokeless tobacco: Never   Tobacco comments:    Started smoking at age 61, smoked one pack a day for  10 years. He picked up vaping after he quit cigarettes.   Vaping Use   Vaping status: Some Days   Devices: one cannister a week.  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    Family History  Problem Relation Age of Onset   Bipolar disorder Mother    Heart Problems Mother    Heart Problems Father    Kidney disease Maternal Uncle    Heart Problems Maternal Grandmother    Lung cancer Maternal Grandmother    Heart Problems Maternal Grandfather    Heart Problems Paternal Grandmother    Heart Problems Paternal Grandfather        Passed from brain tumor   Cancer - Prostate Neg Hx    Colon cancer Neg Hx    ***  Review of Systems  {ros - complete:22885} All other systems reviewed and are negative    Constitutional: {EXAM; GENERAL APPEARANCE:5021} There were no vitals filed for this visit. EYES: anicteric ENMT: Cardiovascular: {Mis exam cardio:32073} Respiratory: {Exam; lungs brief:12271} GI: {  Exam; abdomen:5794} Musculoskeletal: {extremities:315109} Skin: {Skin exam gi:12013} Hematologic: ***  Labs: Lab Results  Component Value Date   WBC 8.9 05/10/2023   HGB 11.9 (L) 05/10/2023   HCT 35.2 (L) 05/10/2023   MCV 88.2 05/10/2023   PLT 246 05/10/2023    Lab Results  Component Value Date   CREATININE 0.65 05/10/2023   BUN 16 05/10/2023   NA 141 05/10/2023   K 3.4 (L) 05/10/2023   CL 106 05/10/2023   CO2 28 05/10/2023    Lab Results  Component Value Date   Fowler 22 03/27/2023   AST 21 03/27/2023   ALKPHOS 78 03/27/2023   BILITOT 0.4 03/27/2023     Assessment: ***  *** day   Plan: 1)

## 2023-05-15 ENCOUNTER — Encounter: Payer: Self-pay | Admitting: Internal Medicine

## 2023-05-15 DIAGNOSIS — R600 Localized edema: Secondary | ICD-10-CM | POA: Insufficient documentation

## 2024-03-09 ENCOUNTER — Ambulatory Visit
Admission: EM | Admit: 2024-03-09 | Discharge: 2024-03-09 | Disposition: A | Discharge status: Home / Self Care | Attending: Nurse Practitioner | Admitting: Nurse Practitioner

## 2024-03-09 DIAGNOSIS — R109 Unspecified abdominal pain: Secondary | ICD-10-CM | POA: Diagnosis present

## 2024-03-09 LAB — POCT URINALYSIS DIP (MANUAL ENTRY)
Glucose, UA: NEGATIVE mg/dL
Leukocytes, UA: NEGATIVE
Nitrite, UA: NEGATIVE
Protein Ur, POC: >=300 mg/dL — AB
Spec Grav, UA: 1.025 (ref 1.010–1.025)
Urobilinogen, UA: 1 U/dL
pH, UA: 6.5 (ref 5.0–8.0)

## 2024-03-09 MED ORDER — ACETAMINOPHEN ER 650 MG PO TBCR: 650.0000 mg | EXTENDED_RELEASE_TABLET | Freq: Three times a day (TID) | ORAL | 0 refills | Status: AC | PRN

## 2024-03-09 MED ORDER — TAMSULOSIN HCL 0.4 MG PO CAPS: 0.4000 mg | ORAL_CAPSULE | Freq: Every day | ORAL | 0 refills | Status: AC

## 2024-03-09 NOTE — Discharge Instructions (Addendum)
 Urine culture has been ordered.  You will be contacted when the results of the culture are received.  You will also have access to your results via MyChart. Take medication as prescribed. Increase fluids allow for plenty of rest. Recommend drinking at least 8-10 8 ounce glasses of water daily while symptoms persist. You may use warm compresses to the affected area to help with pain or discomfort. As discussed, if your pain worsens, recommend follow-up in the emergency department for further evaluation.  Also recommend ER follow-up if you develop new symptoms of fever, chills, or other concerns. Follow-up as needed.
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
## 2024-03-10 LAB — URINE CULTURE: Culture: NO GROWTH

## 2024-03-11 ENCOUNTER — Ambulatory Visit (HOSPITAL_COMMUNITY): Payer: Self-pay
